# Patient Record
Sex: Female | Born: 1995 | Race: White | Hispanic: No | Marital: Single | Smoking: Current every day smoker
Health system: Southern US, Community
[De-identification: ages and names within clinical notes are randomized; demographics above are authoritative.]

## PROBLEM LIST (undated history)

## (undated) ENCOUNTER — Inpatient Hospital Stay (HOSPITAL_COMMUNITY): Payer: Self-pay

## (undated) DIAGNOSIS — G43909 Migraine, unspecified, not intractable, without status migrainosus: Secondary | ICD-10-CM

## (undated) DIAGNOSIS — Q513 Bicornate uterus: Secondary | ICD-10-CM

## (undated) HISTORY — PX: NO PAST SURGERIES: SHX2092

## (undated) HISTORY — DX: Migraine, unspecified, not intractable, without status migrainosus: G43.909

---
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
## 2014-06-13 ENCOUNTER — Encounter (HOSPITAL_COMMUNITY): Payer: Self-pay | Admitting: *Deleted

## 2014-07-11 ENCOUNTER — Encounter (HOSPITAL_COMMUNITY): Payer: Self-pay

## 2014-07-11 ENCOUNTER — Inpatient Hospital Stay (HOSPITAL_COMMUNITY)
Admission: AD | Admit: 2014-07-11 | Discharge: 2014-07-11 | Disposition: A | Discharge status: Home / Self Care | Payer: Medicaid Other | Source: Ambulatory Visit | Attending: Obstetrics & Gynecology | Admitting: Obstetrics & Gynecology

## 2014-07-11 DIAGNOSIS — O0930 Supervision of pregnancy with insufficient antenatal care, unspecified trimester: Secondary | ICD-10-CM

## 2014-07-11 DIAGNOSIS — M549 Dorsalgia, unspecified: Secondary | ICD-10-CM | POA: Insufficient documentation

## 2014-07-11 DIAGNOSIS — O99332 Smoking (tobacco) complicating pregnancy, second trimester: Secondary | ICD-10-CM | POA: Insufficient documentation

## 2014-07-11 DIAGNOSIS — Z3A25 25 weeks gestation of pregnancy: Secondary | ICD-10-CM | POA: Diagnosis not present

## 2014-07-11 DIAGNOSIS — O34592 Maternal care for other abnormalities of gravid uterus, second trimester: Secondary | ICD-10-CM | POA: Insufficient documentation

## 2014-07-11 DIAGNOSIS — O99891 Other specified diseases and conditions complicating pregnancy: Secondary | ICD-10-CM | POA: Diagnosis present

## 2014-07-11 DIAGNOSIS — O9989 Other specified diseases and conditions complicating pregnancy, childbirth and the puerperium: Secondary | ICD-10-CM | POA: Diagnosis not present

## 2014-07-11 DIAGNOSIS — Q513 Bicornate uterus: Secondary | ICD-10-CM | POA: Diagnosis not present

## 2014-07-11 DIAGNOSIS — F17210 Nicotine dependence, cigarettes, uncomplicated: Secondary | ICD-10-CM | POA: Diagnosis not present

## 2014-07-11 LAB — URINE MICROSCOPIC-ADD ON

## 2014-07-11 LAB — CBC
HEMATOCRIT: 30.3 % — AB (ref 36.0–46.0)
HEMOGLOBIN: 10.2 g/dL — AB (ref 12.0–15.0)
MCH: 28.7 pg (ref 26.0–34.0)
MCHC: 33.7 g/dL (ref 30.0–36.0)
MCV: 85.1 fL (ref 78.0–100.0)
Platelets: 165 10*3/uL (ref 150–400)
RBC: 3.56 MIL/uL — AB (ref 3.87–5.11)
RDW: 13.7 % (ref 11.5–15.5)
WBC: 12 10*3/uL — AB (ref 4.0–10.5)

## 2014-07-11 LAB — URINALYSIS, ROUTINE W REFLEX MICROSCOPIC
BILIRUBIN URINE: NEGATIVE
Glucose, UA: NEGATIVE mg/dL
KETONES UR: NEGATIVE mg/dL
NITRITE: NEGATIVE
Protein, ur: NEGATIVE mg/dL
Specific Gravity, Urine: 1.01 (ref 1.005–1.030)
UROBILINOGEN UA: 0.2 mg/dL (ref 0.0–1.0)
pH: 7 (ref 5.0–8.0)

## 2014-07-11 LAB — DIFFERENTIAL
BASOS ABS: 0 10*3/uL (ref 0.0–0.1)
Basophils Relative: 0 % (ref 0–1)
Eosinophils Absolute: 0.2 10*3/uL (ref 0.0–0.7)
Eosinophils Relative: 1 % (ref 0–5)
LYMPHS ABS: 1.7 10*3/uL (ref 0.7–4.0)
LYMPHS PCT: 14 % (ref 12–46)
MONO ABS: 1.1 10*3/uL — AB (ref 0.1–1.0)
MONOS PCT: 9 % (ref 3–12)
NEUTROS ABS: 9.1 10*3/uL — AB (ref 1.7–7.7)
Neutrophils Relative %: 76 % (ref 43–77)

## 2014-07-11 LAB — HEPATITIS B SURFACE ANTIGEN: Hepatitis B Surface Ag: NEGATIVE

## 2014-07-11 LAB — TYPE AND SCREEN
ABO/RH(D): A POS
Antibody Screen: NEGATIVE

## 2014-07-11 LAB — HIV ANTIBODY (ROUTINE TESTING W REFLEX): HIV: NONREACTIVE

## 2014-07-11 LAB — RPR

## 2014-07-11 LAB — RAPID HIV SCREEN (WH-MAU): SUDS RAPID HIV SCREEN: NONREACTIVE

## 2014-07-11 MED ORDER — ACETAMINOPHEN 500 MG PO TABS: 1000.0000 mg | ORAL_TABLET | Freq: Three times a day (TID) | ORAL | Status: DC | PRN

## 2014-07-11 NOTE — MAU Note (Signed)
Patient states she has had no prenatal care. States she has had irregular periods since last child was born about one year ago. Has felt fetal movement. Denies bleeding, abdominal pain, leaking.

## 2014-07-11 NOTE — MAU Provider Note (Signed)
History     CSN: [id number]  Arrival date and time: 07/11/14 1718   None     Chief Complaint  Patient presents with  . Back Pain   HPI  [name] is a 18 y.o. G3P1011 at around 1925 weeks gestational age by unsure LMP. She first discovered she was pregnant about a month ago. She has not had prenatal care yet and would like [facility]. She presents to the MAU today for evaluation of back pain. She has been having back pain off and on for the last several weeks, but worsening in the last few days. Located in her lower back. Feels like a muscle spasm. Has not taken any medication for this. Denies fevers, chills, chest pain, shortness of breath, abdominal pain, nausea, vomiting, flank pain, dysuria, or other concerning symptoms. +Fetal movement. Denies uterine contractions, loss of fluid, vaginal bleeding.   Mostly came to triage today to ensure her baby was doing okay.   Past Medical History  Diagnosis Date  . Migraine     Typically takes Ibuprofen;lie down in dark room  . Bicornate uterus     Past Surgical History  Procedure Laterality Date  . No past surgeries      Family History  Problem Relation Age of Onset  . Heart disease Paternal Grandfather   . Brain cancer Paternal Grandfather   . Hypertension Paternal Grandfather   . Stroke Paternal Grandfather   . Asthma Mother   . Diabetes Mother   . Migraines Mother   . Kidney disease Cousin     Kidney infections:Maternal  . Migraines Maternal Grandmother     History  Substance Use Topics  . Smoking status: Current Every Day Smoker -- 0.50 packs/day    Types: Cigarettes    Last Attempt to Quit: 07/12/2012  . Smokeless tobacco: Never Used  . Alcohol Use: No    Allergies:  Allergies  Allergen Reactions  . Strawberry Anaphylaxis    Rash inside mouth; throat swells     Prescriptions prior to admission  Medication Sig Dispense Refill Last Dose  . ferrous sulfate 325 (65 FE) MG tablet Take  1 tablet (325 mg total) by mouth 2 (two) times daily with a meal. 60 tablet 3   . ibuprofen (ADVIL,MOTRIN) 600 MG tablet Take 1 tablet (600 mg total) by mouth every 6 (six) hours. 30 tablet 0   . oxyCODONE-acetaminophen (PERCOCET/ROXICET) 5-325 MG per tablet Take 1-2 tablets by mouth every 4 (four) hours as needed for severe pain (moderate - severe pain). 30 tablet 0   . Prenatal Vit-Fe Fumarate-FA (PRENATAL MULTIVITAMIN) TABS Take 1 tablet by mouth daily.   07/22/2013 at Unknown time    Review of Systems  Constitutional: Negative.  Negative for fever, chills and malaise/fatigue.  HENT: Negative.  Negative for congestion and sore throat.   Eyes: Negative.  Negative for double vision and photophobia.  Respiratory: Negative.  Negative for cough, shortness of breath and wheezing.   Cardiovascular: Negative.  Negative for chest pain and leg swelling.  Gastrointestinal: Negative.  Negative for nausea, vomiting, abdominal pain, diarrhea and constipation.  Genitourinary: Negative.  Negative for dysuria, urgency, frequency and hematuria.  Musculoskeletal: Positive for back pain. Negative for myalgias.  Skin: Negative.   Neurological: Negative.  Negative for weakness and headaches.  Psychiatric/Behavioral: Negative.   All other systems reviewed and are negative.  Physical Exam   Blood pressure 101/73, pulse 108, temperature 98.3 F (36.8 C), temperature source Oral, resp.  rate 16, height 5' (1.524 m), weight 128 lb (58.06 kg), SpO2 100 %, unknown if currently breastfeeding.  Physical Exam  Nursing note and vitals reviewed. Constitutional: She is oriented to person, place, and time. She appears well-developed and well-nourished. No distress.  HENT:  Head: Normocephalic and atraumatic.  Cardiovascular: Normal rate.   Respiratory: Effort normal.  Musculoskeletal:  Lumbar paraspinal muscles tender to palpation. FROM. No CVA tenderness or flank pain.   Neurological: She is alert and oriented to  person, place, and time.  Skin: Skin is warm and dry.  Psychiatric: She has a normal mood and affect. Her behavior is normal.    MAU Course  Procedures  MDM NST reactive  Assessment and Plan  [name] is a 18 y.o. G3P1011 at around [redacted] weeks gestation based on an unsure LMP here with back pain consistent with musculoskeletal strain and pains of pregnancy. No red flag signs/symptoms on examination. NST reactive and no contractions.   # Back pain - Reviewed exercises, abdominal binder - Reviewed PTL/FM precautions.   # No PNC - Initial OB labs ordered - Anatomy scan ordered for outpatient - Sent message to clinic staff about scheduling appointment for initial prenatal visit.   William DaltonMcEachern, Taleisha Kaczynski 07/11/2014, 7:15 PM

## 2014-07-11 NOTE — MAU Note (Signed)
Urine in lab 

## 2014-07-11 NOTE — MAU Note (Signed)
Pt states irregular cycles after her [age] year old. Denies bleeding or abnormal vaginal discharge. Having low back pain and pelvic pain.

## 2014-07-12 LAB — RUBELLA SCREEN: Rubella: 1.52 Index — ABNORMAL HIGH (ref <0.90)

## 2014-07-12 LAB — ABO/RH: ABO/RH(D): A POS

## 2014-07-20 ENCOUNTER — Encounter: Payer: Self-pay | Admitting: Advanced Practice Midwife

## 2014-07-20 ENCOUNTER — Ambulatory Visit (INDEPENDENT_AMBULATORY_CARE_PROVIDER_SITE_OTHER): Payer: Medicaid Other | Admitting: Advanced Practice Midwife

## 2014-07-20 VITALS — BP 98/69 | HR 94 | Temp 97.7°F | Wt 128.0 lb

## 2014-07-20 DIAGNOSIS — Z23 Encounter for immunization: Secondary | ICD-10-CM | POA: Diagnosis not present

## 2014-07-20 DIAGNOSIS — Z3493 Encounter for supervision of normal pregnancy, unspecified, third trimester: Secondary | ICD-10-CM

## 2014-07-20 DIAGNOSIS — Z3483 Encounter for supervision of other normal pregnancy, third trimester: Secondary | ICD-10-CM

## 2014-07-20 DIAGNOSIS — O0933 Supervision of pregnancy with insufficient antenatal care, third trimester: Secondary | ICD-10-CM

## 2014-07-20 LAB — POCT URINALYSIS DIP (DEVICE)
Bilirubin Urine: NEGATIVE
Glucose, UA: NEGATIVE mg/dL
Ketones, ur: NEGATIVE mg/dL
Nitrite: NEGATIVE
Protein, ur: NEGATIVE mg/dL
Specific Gravity, Urine: 1.02 (ref 1.005–1.030)
Urobilinogen, UA: 0.2 mg/dL (ref 0.0–1.0)
pH: 5.5 (ref 5.0–8.0)

## 2014-07-20 LAB — OB RESULTS CONSOLE GC/CHLAMYDIA
CHLAMYDIA, DNA PROBE: NEGATIVE
Gonorrhea: NEGATIVE

## 2014-07-20 LAB — OB RESULTS CONSOLE GBS: GBS: POSITIVE

## 2014-07-20 MED ORDER — BUTALBITAL-APAP-CAFFEINE 50-325-40 MG PO TABS: 1.0000 | ORAL_TABLET | Freq: Four times a day (QID) | ORAL | Status: DC | PRN

## 2014-07-20 MED ORDER — TETANUS-DIPHTH-ACELL PERTUSSIS 5-2.5-18.5 LF-MCG/0.5 IM SUSP
0.5000 mL | Freq: Once | INTRAMUSCULAR | Status: AC
  Administered 2014-07-20: 0.5 mL via INTRAMUSCULAR

## 2014-07-20 NOTE — Progress Notes (Signed)
Initial visit today-- dating based on unsure LMP. Will schedule U/S.  Initial lab work done in MAU-- 1hrgtt today. Tdap and flu vaccines today.  C/o of lower back pain and migraines.  C/o of occasional contractions.

## 2014-07-20 NOTE — Patient Instructions (Signed)
Third Trimester of Pregnancy The third trimester is from week 29 through week 42, months 7 through 9. The third trimester is a time when the fetus is growing rapidly. At the end of the ninth month, the fetus is about 20 inches in length and weighs 6-10 pounds.  BODY CHANGES Your body goes through many changes during pregnancy. The changes vary from woman to woman.   Your weight will continue to increase. You can expect to gain 25-35 pounds (11-16 kg) by the end of the pregnancy.  You may begin to get stretch marks on your hips, abdomen, and breasts.  You may urinate more often because the fetus is moving lower into your pelvis and pressing on your bladder.  You may develop or continue to have heartburn as a result of your pregnancy.  You may develop constipation because certain hormones are causing the muscles that push waste through your intestines to slow down.  You may develop hemorrhoids or swollen, bulging veins (varicose veins).  You may have pelvic pain because of the weight gain and pregnancy hormones relaxing your joints between the bones in your pelvis. Backaches may result from overexertion of the muscles supporting your posture.  You may have changes in your hair. These can include thickening of your hair, rapid growth, and changes in texture. Some women also have hair loss during or after pregnancy, or hair that feels dry or thin. Your hair will most likely return to normal after your baby is born.  Your breasts will continue to grow and be tender. A yellow discharge may leak from your breasts called colostrum.  Your belly button may stick out.  You may feel short of breath because of your expanding uterus.  You may notice the fetus "dropping," or moving lower in your abdomen.  You may have a bloody mucus discharge. This usually occurs a few days to a week before labor begins.  Your cervix becomes thin and soft (effaced) near your due date. WHAT TO EXPECT AT YOUR PRENATAL  EXAMS  You will have prenatal exams every 2 weeks until week 36. Then, you will have weekly prenatal exams. During a routine prenatal visit:  You will be weighed to make sure you and the fetus are growing normally.  Your blood pressure is taken.  Your abdomen will be measured to track your baby's growth.  The fetal heartbeat will be listened to.  Any test results from the previous visit will be discussed.  You may have a cervical check near your due date to see if you have effaced. At around 36 weeks, your caregiver will check your cervix. At the same time, your caregiver will also perform a test on the secretions of the vaginal tissue. This test is to determine if a type of bacteria, Group B streptococcus, is present. Your caregiver will explain this further. Your caregiver may ask you:  What your birth plan is.  How you are feeling.  If you are feeling the baby move.  If you have had any abnormal symptoms, such as leaking fluid, bleeding, severe headaches, or abdominal cramping.  If you have any questions. Other tests or screenings that may be performed during your third trimester include:  Blood tests that check for low iron levels (anemia).  Fetal testing to check the health, activity level, and growth of the fetus. Testing is done if you have certain medical conditions or if there are problems during the pregnancy. FALSE LABOR You may feel small, irregular contractions that   eventually go away. These are called Braxton Hicks contractions, or false labor. Contractions may last for hours, days, or even weeks before true labor sets in. If contractions come at regular intervals, intensify, or become painful, it is best to be seen by your caregiver.  SIGNS OF LABOR   Menstrual-like cramps.  Contractions that are 5 minutes apart or less.  Contractions that start on the top of the uterus and spread down to the lower abdomen and back.  A sense of increased pelvic pressure or back  pain.  A watery or bloody mucus discharge that comes from the vagina. If you have any of these signs before the 37th week of pregnancy, call your caregiver right away. You need to go to the hospital to get checked immediately. HOME CARE INSTRUCTIONS   Avoid all smoking, herbs, alcohol, and unprescribed drugs. These chemicals affect the formation and growth of the baby.  Follow your caregiver's instructions regarding medicine use. There are medicines that are either safe or unsafe to take during pregnancy.  Exercise only as directed by your caregiver. Experiencing uterine cramps is a good sign to stop exercising.  Continue to eat regular, healthy meals.  Wear a good support bra for breast tenderness.  Do not use hot tubs, steam rooms, or saunas.  Wear your seat belt at all times when driving.  Avoid raw meat, uncooked cheese, cat litter boxes, and soil used by cats. These carry germs that can cause birth defects in the baby.  Take your prenatal vitamins.  Try taking a stool softener (if your caregiver approves) if you develop constipation. Eat more high-fiber foods, such as fresh vegetables or fruit and whole grains. Drink plenty of fluids to keep your urine clear or pale yellow.  Take warm sitz baths to soothe any pain or discomfort caused by hemorrhoids. Use hemorrhoid cream if your caregiver approves.  If you develop varicose veins, wear support hose. Elevate your feet for 15 minutes, 3-4 times a day. Limit salt in your diet.  Avoid heavy lifting, wear low heal shoes, and practice good posture.  Rest a lot with your legs elevated if you have leg cramps or low back pain.  Visit your dentist if you have not gone during your pregnancy. Use a soft toothbrush to brush your teeth and be gentle when you floss.  A sexual relationship may be continued unless your caregiver directs you otherwise.  Do not travel far distances unless it is absolutely necessary and only with the approval  of your caregiver.  Take prenatal classes to understand, practice, and ask questions about the labor and delivery.  Make a trial run to the hospital.  Pack your hospital bag.  Prepare the baby's nursery.  Continue to go to all your prenatal visits as directed by your caregiver. SEEK MEDICAL CARE IF:  You are unsure if you are in labor or if your water has broken.  You have dizziness.  You have mild pelvic cramps, pelvic pressure, or nagging pain in your abdominal area.  You have persistent nausea, vomiting, or diarrhea.  You have a bad smelling vaginal discharge.  You have pain with urination. SEEK IMMEDIATE MEDICAL CARE IF:   You have a fever.  You are leaking fluid from your vagina.  You have spotting or bleeding from your vagina.  You have severe abdominal cramping or pain.  You have rapid weight loss or gain.  You have shortness of breath with chest pain.  You notice sudden or extreme swelling   of your face, hands, ankles, feet, or legs.  You have not felt your baby move in over an hour.  You have severe headaches that do not go away with medicine.  You have vision changes. Document Released: 07/23/2001 Document Revised: 08/03/2013 Document Reviewed: 09/29/2012 Exit[employer] Patient Information 2015 [employer]. This information is not intended to replace advice given to you by your health care provider. Make sure you discuss any questions you have with your health care provider.  

## 2014-07-20 NOTE — Progress Notes (Signed)
   Subjective:    [name] is a [id number] 3422w3d being seen today for her first obstetrical visit.  Her obstetrical history is significant for anemia. Patient does intend to breast feed. Pregnancy history fully reviewed.  Patient reports backache, no bleeding, no contractions, no cramping and no leaking. Patient had questions regarding normal changes during pregnancy, water intake and use of baby ASA. All of the above mentioned subjects discussed in detail.  Filed Vitals:   07/20/14 1018  BP: 98/69  Pulse: 94  Temp: 97.7 F (36.5 C)  Weight: 128 lb (58.06 kg)    HISTORY: OB History  Gravida Para Term Preterm AB SAB TAB Ectopic Multiple Living  3 1 1  1 1    1     # Outcome Date GA Lbr Len/2nd Weight Sex Delivery Anes PTL Lv  3 Current           2 Term 07/23/13 1140w3d 04:40 / 00:15 5 lb 10.7 oz (2.571 kg) F Vag-Spont EPI  Y  1 SAB  3747w0d            Comments: System Generated. Please review and update pregnancy details.     Past Medical History  Diagnosis Date  . Migraine     Typically takes Ibuprofen;lie down in dark room  . Bicornate uterus    Past Surgical History  Procedure Laterality Date  . No past surgeries     Family History  Problem Relation Age of Onset  . Heart disease Paternal Grandfather   . Brain cancer Paternal Grandfather   . Hypertension Paternal Grandfather   . Stroke Paternal Grandfather   . Asthma Mother   . Diabetes Mother   . Migraines Mother   . Kidney disease Cousin     Kidney infections:Maternal  . Migraines Maternal Grandmother      Exam    Uterus:     Pelvic Exam:    Perineum: Normal Perineum   Vulva: normal   Vagina:  normal mucosa   pH: n/a   Cervix: closed   Adnexa: not evaluated   Bony Pelvis: average  System: Breast:  normal appearance, no masses or tenderness   Skin: normal coloration and turgor, no rashes    Neurologic: oriented, normal, normal mood   Extremities: normal strength, tone, and muscle mass   HEENT  PERRLA, neck supple with midline trachea and trachea midline   Mouth/Teeth mucous membranes moist, pharynx normal without lesions   Neck supple and no masses   Cardiovascular: regular rate and rhythm   Respiratory:  appears well, vitals normal, no respiratory distress, acyanotic, normal RR, ear and throat exam is normal, neck free of mass or lymphadenopathy, chest clear, no wheezing, crepitations, rhonchi, normal symmetric air entry   Abdomen: Gravid, no tenderness, positive bowel sounds   Urinary: bladder not palpable      Assessment:    Pregnancy: [id number] Patient Active Problem List   Diagnosis Date Noted  . Back pain affecting pregnancy 07/11/2014  . No prenatal care in current pregnancy 07/11/2014  . Anemia, iron deficiency 07/26/2013        Plan:     Initial labs drawn. Prenatal vitamins. Problem list reviewed and updated. Genetic Screening discussed : too late for quad screen Ultrasound discussed; fetal survey: ordered. Follow up in 4 weeks. 50% of 40 min visit spent on counseling and coordination of care.     Zidan Helget SHWON 07/20/2014  12:00 PM

## 2014-07-21 LAB — GLUCOSE TOLERANCE, 1 HOUR (50G) W/O FASTING: GLUCOSE 1 HOUR GTT: 96 mg/dL (ref 70–140)

## 2014-07-21 LAB — GC/CHLAMYDIA PROBE AMP
CT PROBE, AMP APTIMA: NEGATIVE
GC Probe RNA: NEGATIVE

## 2014-07-22 LAB — CULTURE, OB URINE: Colony Count: >=100000

## 2014-07-24 ENCOUNTER — Encounter: Payer: Self-pay | Admitting: Advanced Practice Midwife

## 2014-07-24 DIAGNOSIS — Z3493 Encounter for supervision of normal pregnancy, unspecified, third trimester: Secondary | ICD-10-CM | POA: Insufficient documentation

## 2014-07-25 ENCOUNTER — Telehealth: Payer: Self-pay | Admitting: *Deleted

## 2014-07-25 ENCOUNTER — Encounter: Payer: Self-pay | Admitting: *Deleted

## 2014-07-25 DIAGNOSIS — N390 Urinary tract infection, site not specified: Secondary | ICD-10-CM

## 2014-07-25 LAB — CANNABANOIDS (GC/LC/MS), URINE: THC-COOH UR CONFIRM: 769 ng/mL — AB (ref <5)

## 2014-07-25 MED ORDER — AMOXICILLIN 875 MG PO TABS: 875.0000 mg | ORAL_TABLET | Freq: Two times a day (BID) | ORAL | Status: DC

## 2014-07-25 NOTE — Telephone Encounter (Signed)
Attempted to contact patient with results, no answer, message states that we have reached a number that the mailbox has not been set up.  Will send certified letter.  Letter sent.

## 2014-07-25 NOTE — Telephone Encounter (Signed)
-----   Message from [place], [place] sent at 07/24/2014  5:03 AM EST ----- GBS UTI. Amoxicillin 875 mg by mouth twice a day 7 days.

## 2014-07-25 NOTE — Progress Notes (Signed)
I was present for the exam and agree with above.  [place] Lucah Petta, CNM 07/25/2014 11:42 PM

## 2014-07-26 ENCOUNTER — Ambulatory Visit (HOSPITAL_COMMUNITY)
Admission: RE | Admit: 2014-07-26 | Discharge: 2014-07-26 | Disposition: A | Discharge status: Home / Self Care | Payer: Medicaid Other | Source: Ambulatory Visit | Attending: Advanced Practice Midwife | Admitting: Advanced Practice Midwife

## 2014-07-26 ENCOUNTER — Other Ambulatory Visit: Payer: Self-pay | Admitting: Advanced Practice Midwife

## 2014-07-26 ENCOUNTER — Encounter: Payer: Self-pay | Admitting: Advanced Practice Midwife

## 2014-07-26 DIAGNOSIS — O99333 Smoking (tobacco) complicating pregnancy, third trimester: Secondary | ICD-10-CM | POA: Insufficient documentation

## 2014-07-26 DIAGNOSIS — Z1389 Encounter for screening for other disorder: Secondary | ICD-10-CM | POA: Insufficient documentation

## 2014-07-26 DIAGNOSIS — O0933 Supervision of pregnancy with insufficient antenatal care, third trimester: Secondary | ICD-10-CM

## 2014-07-26 DIAGNOSIS — Z3A29 29 weeks gestation of pregnancy: Secondary | ICD-10-CM | POA: Insufficient documentation

## 2014-07-26 DIAGNOSIS — Z36 Encounter for antenatal screening of mother: Secondary | ICD-10-CM | POA: Diagnosis not present

## 2014-07-26 DIAGNOSIS — F17210 Nicotine dependence, cigarettes, uncomplicated: Secondary | ICD-10-CM | POA: Insufficient documentation

## 2014-07-26 DIAGNOSIS — F1290 Cannabis use, unspecified, uncomplicated: Secondary | ICD-10-CM | POA: Insufficient documentation

## 2014-07-26 LAB — PRESCRIPTION MONITORING PROFILE (19 PANEL)
AMPHETAMINE/METH: NEGATIVE ng/mL
BUPRENORPHINE, URINE: NEGATIVE ng/mL
Barbiturate Screen, Urine: NEGATIVE ng/mL
Benzodiazepine Screen, Urine: NEGATIVE ng/mL
CARISOPRODOL, URINE: NEGATIVE ng/mL
COCAINE METABOLITES: NEGATIVE ng/mL
Creatinine, Urine: 152.58 mg/dL (ref >20.0)
FENTANYL URINE: NEGATIVE ng/mL
MDMA URINE: NEGATIVE ng/mL
METHADONE SCREEN, URINE: NEGATIVE ng/mL
METHAQUALONE SCREEN (URINE): NEGATIVE ng/mL
Meperidine, Ur: NEGATIVE ng/mL
Nitrites, Initial: NEGATIVE ug/mL
Opiate Screen, Urine: NEGATIVE ng/mL
Oxycodone Screen, Ur: NEGATIVE ng/mL
Phencyclidine, Ur: NEGATIVE ng/mL
Propoxyphene: NEGATIVE ng/mL
TAPENTADOLUR: NEGATIVE ng/mL
Tramadol Scrn, Ur: NEGATIVE ng/mL
Zolpidem, Urine: NEGATIVE ng/mL
pH, Initial: 6 pH (ref 4.5–8.9)

## 2014-07-27 ENCOUNTER — Encounter: Payer: Self-pay | Admitting: Advanced Practice Midwife

## 2014-08-03 ENCOUNTER — Encounter: Payer: Medicaid Other | Admitting: Obstetrics & Gynecology

## 2014-08-10 ENCOUNTER — Encounter: Payer: Medicaid Other | Admitting: Obstetrics & Gynecology

## 2014-08-17 ENCOUNTER — Encounter: Payer: Medicaid Other | Admitting: Advanced Practice Midwife

## 2014-08-17 ENCOUNTER — Ambulatory Visit (INDEPENDENT_AMBULATORY_CARE_PROVIDER_SITE_OTHER): Payer: Medicaid Other | Admitting: Advanced Practice Midwife

## 2014-08-17 VITALS — BP 107/75 | HR 97 | Temp 98.4°F | Wt 133.4 lb

## 2014-08-17 DIAGNOSIS — O2340 Unspecified infection of urinary tract in pregnancy, unspecified trimester: Secondary | ICD-10-CM

## 2014-08-17 DIAGNOSIS — B951 Streptococcus, group B, as the cause of diseases classified elsewhere: Secondary | ICD-10-CM

## 2014-08-17 DIAGNOSIS — O2343 Unspecified infection of urinary tract in pregnancy, third trimester: Secondary | ICD-10-CM

## 2014-08-17 LAB — POCT URINALYSIS DIP (DEVICE)
BILIRUBIN URINE: NEGATIVE
GLUCOSE, UA: NEGATIVE mg/dL
Ketones, ur: NEGATIVE mg/dL
Nitrite: NEGATIVE
PROTEIN: NEGATIVE mg/dL
Specific Gravity, Urine: 1.02 (ref 1.005–1.030)
Urobilinogen, UA: 1 mg/dL (ref 0.0–1.0)
pH: 6 (ref 5.0–8.0)

## 2014-08-17 NOTE — Progress Notes (Signed)
Pain- back Reorder Amox for GBS in urine- pt never took medication

## 2014-08-17 NOTE — Progress Notes (Signed)
Doing well.  Good fetal movement, denies vaginal bleeding, LOF, regular contractions.  Discussed GBS positive urine and UTI with pt.  Pt to pick up amoxicillin today and complete course.  Will give PCN during labor.  Discussed sleep during pregnancy, recommend warm bath nightly, exercise daily, Benadryl PRN for sleep.

## 2014-08-18 DIAGNOSIS — O0933 Supervision of pregnancy with insufficient antenatal care, third trimester: Secondary | ICD-10-CM

## 2014-08-18 DIAGNOSIS — O2343 Unspecified infection of urinary tract in pregnancy, third trimester: Secondary | ICD-10-CM

## 2014-08-18 DIAGNOSIS — O99820 Streptococcus B carrier state complicating pregnancy: Secondary | ICD-10-CM

## 2014-08-23 ENCOUNTER — Encounter: Payer: Self-pay | Admitting: General Practice

## 2014-08-24 ENCOUNTER — Encounter: Payer: Medicaid Other | Admitting: Advanced Practice Midwife

## 2014-08-24 ENCOUNTER — Encounter: Payer: Self-pay | Admitting: Advanced Practice Midwife

## 2014-08-24 ENCOUNTER — Telehealth: Payer: Self-pay | Admitting: Advanced Practice Midwife

## 2014-08-24 NOTE — Telephone Encounter (Signed)
Patient missed her ob follow-up. Attempted to call patient number disconnected, mailing certified letter.

## 2014-09-15 ENCOUNTER — Encounter (HOSPITAL_COMMUNITY): Payer: Self-pay | Admitting: *Deleted

## 2014-09-15 ENCOUNTER — Encounter (HOSPITAL_COMMUNITY): Payer: Self-pay | Admitting: Anesthesiology

## 2014-09-15 ENCOUNTER — Inpatient Hospital Stay (HOSPITAL_COMMUNITY)
Admission: AD | Admit: 2014-09-15 | Discharge: 2014-09-17 | DRG: 766 | Disposition: A | Discharge status: Home / Self Care | Payer: Medicaid Other | Source: Ambulatory Visit | Attending: Family Medicine | Admitting: Family Medicine

## 2014-09-15 DIAGNOSIS — O99334 Smoking (tobacco) complicating childbirth: Secondary | ICD-10-CM | POA: Diagnosis present

## 2014-09-15 DIAGNOSIS — O34592 Maternal care for other abnormalities of gravid uterus, second trimester: Secondary | ICD-10-CM | POA: Diagnosis not present

## 2014-09-15 DIAGNOSIS — F17210 Nicotine dependence, cigarettes, uncomplicated: Secondary | ICD-10-CM | POA: Diagnosis present

## 2014-09-15 DIAGNOSIS — Z8249 Family history of ischemic heart disease and other diseases of the circulatory system: Secondary | ICD-10-CM | POA: Diagnosis not present

## 2014-09-15 DIAGNOSIS — Z30018 Encounter for initial prescription of other contraceptives: Secondary | ICD-10-CM

## 2014-09-15 DIAGNOSIS — O99013 Anemia complicating pregnancy, third trimester: Secondary | ICD-10-CM | POA: Diagnosis not present

## 2014-09-15 DIAGNOSIS — O321XX0 Maternal care for breech presentation, not applicable or unspecified: Secondary | ICD-10-CM | POA: Diagnosis present

## 2014-09-15 DIAGNOSIS — Q513 Bicornate uterus: Secondary | ICD-10-CM

## 2014-09-15 DIAGNOSIS — Z833 Family history of diabetes mellitus: Secondary | ICD-10-CM

## 2014-09-15 DIAGNOSIS — IMO0001 Reserved for inherently not codable concepts without codable children: Secondary | ICD-10-CM

## 2014-09-15 DIAGNOSIS — O0933 Supervision of pregnancy with insufficient antenatal care, third trimester: Secondary | ICD-10-CM

## 2014-09-15 DIAGNOSIS — Z823 Family history of stroke: Secondary | ICD-10-CM

## 2014-09-15 DIAGNOSIS — O9902 Anemia complicating childbirth: Secondary | ICD-10-CM | POA: Diagnosis present

## 2014-09-15 DIAGNOSIS — D649 Anemia, unspecified: Secondary | ICD-10-CM | POA: Diagnosis present

## 2014-09-15 DIAGNOSIS — Z3A37 37 weeks gestation of pregnancy: Secondary | ICD-10-CM | POA: Diagnosis present

## 2014-09-15 DIAGNOSIS — Z98891 History of uterine scar from previous surgery: Secondary | ICD-10-CM

## 2014-09-15 DIAGNOSIS — O34593 Maternal care for other abnormalities of gravid uterus, third trimester: Secondary | ICD-10-CM | POA: Diagnosis present

## 2014-09-15 DIAGNOSIS — O99824 Streptococcus B carrier state complicating childbirth: Secondary | ICD-10-CM | POA: Diagnosis present

## 2014-09-15 DIAGNOSIS — Z30017 Encounter for initial prescription of implantable subdermal contraceptive: Secondary | ICD-10-CM

## 2014-09-15 DIAGNOSIS — Z3493 Encounter for supervision of normal pregnancy, unspecified, third trimester: Secondary | ICD-10-CM

## 2014-09-15 MED ORDER — OXYTOCIN 40 UNITS IN LACTATED RINGERS INFUSION - SIMPLE MED: 62.5000 mL/h | INTRAVENOUS | Status: DC

## 2014-09-15 MED ORDER — OXYTOCIN BOLUS FROM INFUSION: 500.0000 mL | INTRAVENOUS | Status: DC

## 2014-09-15 MED ORDER — ACETAMINOPHEN 325 MG PO TABS: 650.0000 mg | ORAL_TABLET | ORAL | Status: DC | PRN

## 2014-09-15 MED ORDER — OXYCODONE-ACETAMINOPHEN 5-325 MG PO TABS: 2.0000 | ORAL_TABLET | ORAL | Status: DC | PRN

## 2014-09-15 MED ORDER — LACTATED RINGERS IV SOLN: 500.0000 mL | INTRAVENOUS | Status: DC | PRN

## 2014-09-15 MED ORDER — DEXTROSE 5 % IV SOLN
5.0000 10*6.[IU] | Freq: Once | INTRAVENOUS | Status: DC
  Filled 2014-09-15: qty 5

## 2014-09-15 MED ORDER — LIDOCAINE HCL (PF) 1 % IJ SOLN: 30.0000 mL | INTRAMUSCULAR | Status: DC | PRN

## 2014-09-15 MED ORDER — CITRIC ACID-SODIUM CITRATE 334-500 MG/5ML PO SOLN
30.0000 mL | ORAL | Status: DC | PRN
  Administered 2014-09-16: 30 mL via ORAL

## 2014-09-15 MED ORDER — OXYCODONE-ACETAMINOPHEN 5-325 MG PO TABS: 1.0000 | ORAL_TABLET | ORAL | Status: DC | PRN

## 2014-09-15 MED ORDER — PENICILLIN G POTASSIUM 5000000 UNITS IJ SOLR
2.5000 10*6.[IU] | INTRAMUSCULAR | Status: DC
  Filled 2014-09-15: qty 2.5

## 2014-09-15 MED ORDER — LACTATED RINGERS IV SOLN: INTRAVENOUS | Status: DC

## 2014-09-15 MED ORDER — ONDANSETRON HCL 4 MG/2ML IJ SOLN: 4.0000 mg | Freq: Four times a day (QID) | INTRAMUSCULAR | Status: DC | PRN

## 2014-09-15 NOTE — MAU Note (Signed)
Pt states she has been having contractions since 3 am  09/15/2014 Pt states contractions are coming about q 5mins.

## 2014-09-15 NOTE — H&P (Signed)
[name] is a 19 y.o. G60P1011 female at [redacted]w[redacted]d by unsure LMP and 29 week [place],  presenting in active labor.   Reports active fetal movement, contractions: regular, vaginal bleeding: none, membranes: intact. Initiated prenatal care at [facility] at 29 wks.   Most recent u/s 07/26/14. Pregnancy complicated by limited prenatal care, anemia, +GBS & marijuana use.  H/O Migraines, bicornate uterus  Past Medical History: Past Medical History  Diagnosis Date  . Migraine     Typically takes Ibuprofen;lie down in dark room  . Bicornate uterus     Past Surgical History: Past Surgical History  Procedure Laterality Date  . No past surgeries      Obstetrical History: OB History    Gravida Para Term Preterm AB TAB SAB Ectopic Multiple Living   Social History: History   Social History  . Marital Status: Single    Spouse Name: N/A    Number of Children: N/A  . Years of Education: 10   Social History Main Topics  . Smoking status: Current Every Day Smoker -- 0.50 packs/day    Types: Cigarettes    Last Attempt to Quit: 07/12/2012  . Smokeless tobacco: Never Used  . Alcohol Use: No  . Drug Use: No  . Sexual Activity:    Partners: Male   Other Topics Concern  . None   Social History Narrative    Family History: Family History  Problem Relation Age of Onset  . Heart disease Paternal Grandfather   . Brain cancer Paternal Grandfather   . Hypertension Paternal Grandfather   . Stroke Paternal Grandfather   . Asthma Mother   . Diabetes Mother   . Migraines Mother   . Kidney disease Cousin     Kidney infections:Maternal  . Migraines Maternal Grandmother     Allergies: Allergies  Allergen Reactions  . Shrimp [Shellfish Allergy] Shortness Of Breath and Swelling  . Strawberry Anaphylaxis    Rash inside mouth; throat swells     Prescriptions prior to admission  Medication Sig Dispense Refill Last Dose  . acetaminophen (TYLENOL) 500 MG tablet Take 2  tablets (1,000 mg total) by mouth every 8 (eight) hours as needed for headache. 30 tablet 0 Taking  . amoxicillin (AMOXIL) 875 MG tablet Take 1 tablet (875 mg total) by mouth 2 (two) times daily. 14 tablet 0   . butalbital-acetaminophen-caffeine (FIORICET) 50-325-40 MG per tablet Take 1-2 tablets by mouth every 6 (six) hours as needed for headache. 20 tablet 1   . Prenatal Vit-Fe Fumarate-FA (PRENATAL MULTIVITAMIN) TABS Take 1 tablet by mouth daily.   Taking     Review of Systems  Pertinent pos/neg as indicated in HPI   Blood pressure 125/86, pulse 99, resp. rate 16, height  (1.499 m), weight 65.318 kg (144 lb), last menstrual period 12/19/2013, SpO2 99 %, unknown if currently breastfeeding. General appearance: alert, cooperative and no distress Lungs: clear to auscultation bilaterally Heart: regular rate and rhythm Abdomen: gravid, soft, non-tender Extremities: no edema DTR's 3+  Fetal monitoring: FHR: 144 bpm, variability: moderate,  Accelerations: Present,  decelerations:  Absent Uterine activity: regular  Dilation: 6.5 Effacement (%): 80 Station: -2 Exam by:: Carly Norrie RN Presentation: breech confirmed by [place]   Prenatal labs: ABO, Rh: --/--/A POS (11/30 1938) Antibody: NEG (11/30 1933) Rubella:  IMMUNE RPR: NON REAC (11/30 1933)  HBsAg: NEGATIVE (11/30 1933)  HIV: NONREACTIVE (11/30 1933)  GBS:  POSITIVE  1 hr Glucola: 96 Genetic screening:  Not performed Anatomy US: Normal female, but incomplete exam  No results found for this or any previous visit (from the past 24 hour(s)).   Assessment:  8134w0d SIUP  G3P1011  Active labor  Cat 1 FHR  GBS  positive  Breech  Bicornuate uterus  Plan:  Terbutaline was given to stop contractions.  The risks of cesarean section discussed with the patient included but were not limited to: bleeding which may require transfusion or reoperation; infection which may require antibiotics; injury to bowel, bladder, ureters or  other surrounding organs; injury to the fetus; need for additional procedures including hysterectomy in the event of a life-threatening hemorrhage; placental abnormalities wth subsequent pregnancies, incisional problems, thromboembolic phenomenon and other postoperative/anesthesia complications. The patient concurred with the proposed plan, giving informed written consent for the procedure.   Patient has been NPO 1 hour ago.  She will remain NPO for procedure. Anesthesia and OR aware.  Preoperative prophylactic Ancef ordered on call to the OR.  To OR when ready.  Carly CelesteSTINSON, [name] [name] 09/16/2014 12:13 AM

## 2014-09-15 NOTE — Anesthesia Preprocedure Evaluation (Deleted)
Anesthesia Evaluation  Patient identified by MRN, date of birth, ID band Patient awake    Reviewed: Allergy & Precautions, NPO status , Patient's Chart, lab work & pertinent test results  Airway Mallampati: III  TM Distance: >3 FB Neck ROM: Full    Dental no notable dental hx. (+) Teeth Intact   Pulmonary Current Smoker,  breath sounds clear to auscultation  Pulmonary exam normal       Cardiovascular negative cardio ROS  Rhythm:Regular Rate:Normal     Neuro/Psych  Headaches, negative psych ROS   GI/Hepatic Neg liver ROS, GERD-  ,  Endo/Other  negative endocrine ROS  Renal/GU negative Renal ROS     Musculoskeletal negative musculoskeletal ROS (+)   Abdominal   Peds  Hematology  (+) anemia ,   Anesthesia Other Findings   Reproductive/Obstetrics (+) Pregnancy Breech presentation Bicornuate uterus                             Anesthesia Physical Anesthesia Plan  ASA: II and emergent  Anesthesia Plan: Spinal   Post-op Pain Management:    Induction:   Airway Management Planned: Natural Airway  Additional Equipment:   Intra-op Plan:   Post-operative Plan:   Informed Consent: I have reviewed the patients History and Physical, chart, labs and discussed the procedure including the risks, benefits and alternatives for the proposed anesthesia with the patient or authorized representative who has indicated his/her understanding and acceptance.     Plan Discussed with: Anesthesiologist, CRNA and Surgeon  Anesthesia Plan Comments:         Anesthesia Quick Evaluation

## 2014-09-16 ENCOUNTER — Encounter (HOSPITAL_COMMUNITY)
Admission: AD | Disposition: A | Discharge status: Home / Self Care | Payer: Self-pay | Source: Ambulatory Visit | Attending: Family Medicine

## 2014-09-16 ENCOUNTER — Inpatient Hospital Stay (HOSPITAL_COMMUNITY): Payer: Medicaid Other | Admitting: Anesthesiology

## 2014-09-16 ENCOUNTER — Encounter (HOSPITAL_COMMUNITY): Payer: Self-pay

## 2014-09-16 DIAGNOSIS — D649 Anemia, unspecified: Secondary | ICD-10-CM

## 2014-09-16 DIAGNOSIS — O99013 Anemia complicating pregnancy, third trimester: Secondary | ICD-10-CM

## 2014-09-16 DIAGNOSIS — Z30017 Encounter for initial prescription of implantable subdermal contraceptive: Secondary | ICD-10-CM

## 2014-09-16 DIAGNOSIS — O99323 Drug use complicating pregnancy, third trimester: Secondary | ICD-10-CM

## 2014-09-16 DIAGNOSIS — Z3A37 37 weeks gestation of pregnancy: Secondary | ICD-10-CM

## 2014-09-16 DIAGNOSIS — O34592 Maternal care for other abnormalities of gravid uterus, second trimester: Secondary | ICD-10-CM

## 2014-09-16 DIAGNOSIS — O99824 Streptococcus B carrier state complicating childbirth: Secondary | ICD-10-CM

## 2014-09-16 DIAGNOSIS — O321XX0 Maternal care for breech presentation, not applicable or unspecified: Secondary | ICD-10-CM

## 2014-09-16 DIAGNOSIS — Z98891 History of uterine scar from previous surgery: Secondary | ICD-10-CM

## 2014-09-16 DIAGNOSIS — F1210 Cannabis abuse, uncomplicated: Secondary | ICD-10-CM

## 2014-09-16 LAB — CBC
HCT: 33.2 % — ABNORMAL LOW (ref 36.0–46.0)
Hemoglobin: 10.9 g/dL — ABNORMAL LOW (ref 12.0–15.0)
MCH: 26 pg (ref 26.0–34.0)
MCHC: 32.8 g/dL (ref 30.0–36.0)
MCV: 79.2 fL (ref 78.0–100.0)
Platelets: 191 10*3/uL (ref 150–400)
RBC: 4.19 MIL/uL (ref 3.87–5.11)
RDW: 15.2 % (ref 11.5–15.5)
WBC: 20.7 10*3/uL — ABNORMAL HIGH (ref 4.0–10.5)

## 2014-09-16 LAB — RAPID URINE DRUG SCREEN, HOSP PERFORMED
AMPHETAMINES: NOT DETECTED
BENZODIAZEPINES: NOT DETECTED
Barbiturates: NOT DETECTED
Cocaine: NOT DETECTED
Opiates: NOT DETECTED
TETRAHYDROCANNABINOL: POSITIVE — AB

## 2014-09-16 LAB — TYPE AND SCREEN
ABO/RH(D): A POS
Antibody Screen: NEGATIVE

## 2014-09-16 SURGERY — Surgical Case
Anesthesia: Spinal | Site: Abdomen

## 2014-09-16 MED ORDER — MORPHINE SULFATE (PF) 0.5 MG/ML IJ SOLN
INTRAMUSCULAR | Status: DC | PRN
  Administered 2014-09-16: .15 mg via INTRATHECAL

## 2014-09-16 MED ORDER — TERBUTALINE SULFATE 1 MG/ML IJ SOLN
INTRAMUSCULAR | Status: AC
  Filled 2014-09-16: qty 1

## 2014-09-16 MED ORDER — WITCH HAZEL-GLYCERIN EX PADS: 1.0000 "application " | MEDICATED_PAD | CUTANEOUS | Status: DC | PRN

## 2014-09-16 MED ORDER — SIMETHICONE 80 MG PO CHEW
80.0000 mg | CHEWABLE_TABLET | ORAL | Status: DC | PRN
  Filled 2014-09-16 (×2): qty 1

## 2014-09-16 MED ORDER — NALBUPHINE HCL 10 MG/ML IJ SOLN: 5.0000 mg | Freq: Once | INTRAMUSCULAR | Status: DC | PRN

## 2014-09-16 MED ORDER — NALOXONE HCL 1 MG/ML IJ SOLN
1.0000 ug/kg/h | INTRAMUSCULAR | Status: DC | PRN
  Filled 2014-09-16: qty 2

## 2014-09-16 MED ORDER — MEPERIDINE HCL 25 MG/ML IJ SOLN
INTRAMUSCULAR | Status: AC
  Filled 2014-09-16: qty 1

## 2014-09-16 MED ORDER — OXYTOCIN 40 UNITS IN LACTATED RINGERS INFUSION - SIMPLE MED: 62.5000 mL/h | INTRAVENOUS | Status: AC

## 2014-09-16 MED ORDER — DIPHENHYDRAMINE HCL 25 MG PO CAPS
25.0000 mg | ORAL_CAPSULE | Freq: Four times a day (QID) | ORAL | Status: DC | PRN
Start: 2014-09-16 — End: 2014-09-17
  Filled 2014-09-16 (×2): qty 1

## 2014-09-16 MED ORDER — ZOLPIDEM TARTRATE 5 MG PO TABS: 5.0000 mg | ORAL_TABLET | Freq: Every evening | ORAL | Status: DC | PRN

## 2014-09-16 MED ORDER — PHENYLEPHRINE 8 MG IN D5W 100 ML (0.08MG/ML) PREMIX OPTIME
INJECTION | INTRAVENOUS | Status: DC | PRN
  Administered 2014-09-16: 40 ug/min via INTRAVENOUS

## 2014-09-16 MED ORDER — DIPHENHYDRAMINE HCL 50 MG/ML IJ SOLN: 12.5000 mg | INTRAMUSCULAR | Status: DC | PRN

## 2014-09-16 MED ORDER — KETOROLAC TROMETHAMINE 30 MG/ML IJ SOLN: 30.0000 mg | Freq: Four times a day (QID) | INTRAMUSCULAR | Status: DC | PRN

## 2014-09-16 MED ORDER — MEPERIDINE HCL 25 MG/ML IJ SOLN
INTRAMUSCULAR | Status: DC | PRN
  Administered 2014-09-16 (×2): 12.5 mg via INTRAVENOUS

## 2014-09-16 MED ORDER — SCOPOLAMINE 1 MG/3DAYS TD PT72
MEDICATED_PATCH | TRANSDERMAL | Status: AC
  Filled 2014-09-16: qty 1

## 2014-09-16 MED ORDER — LACTATED RINGERS IV SOLN
40.0000 [IU] | INTRAVENOUS | Status: DC | PRN
  Administered 2014-09-16: 40 [IU] via INTRAVENOUS

## 2014-09-16 MED ORDER — FENTANYL CITRATE 0.05 MG/ML IJ SOLN
25.0000 ug | INTRAMUSCULAR | Status: DC | PRN
  Administered 2014-09-16 (×2): 50 ug via INTRAVENOUS

## 2014-09-16 MED ORDER — ONDANSETRON HCL 4 MG/2ML IJ SOLN: 4.0000 mg | Freq: Three times a day (TID) | INTRAMUSCULAR | Status: DC | PRN

## 2014-09-16 MED ORDER — ONDANSETRON HCL 4 MG/2ML IJ SOLN
INTRAMUSCULAR | Status: AC
  Filled 2014-09-16: qty 2

## 2014-09-16 MED ORDER — OXYTOCIN 10 UNIT/ML IJ SOLN
INTRAMUSCULAR | Status: AC
  Filled 2014-09-16: qty 4

## 2014-09-16 MED ORDER — NALBUPHINE HCL 10 MG/ML IJ SOLN: 5.0000 mg | INTRAMUSCULAR | Status: DC | PRN

## 2014-09-16 MED ORDER — SCOPOLAMINE 1 MG/3DAYS TD PT72: 1.0000 | MEDICATED_PATCH | Freq: Once | TRANSDERMAL | Status: DC

## 2014-09-16 MED ORDER — CEFAZOLIN SODIUM-DEXTROSE 2-3 GM-% IV SOLR
2.0000 g | INTRAVENOUS | Status: AC
  Administered 2014-09-16: 2 g via INTRAVENOUS

## 2014-09-16 MED ORDER — DIBUCAINE 1 % RE OINT
1.0000 "application " | TOPICAL_OINTMENT | RECTAL | Status: DC | PRN
  Filled 2014-09-16: qty 28

## 2014-09-16 MED ORDER — ONDANSETRON HCL 4 MG/2ML IJ SOLN
INTRAMUSCULAR | Status: DC | PRN
  Administered 2014-09-16: 4 mg via INTRAVENOUS

## 2014-09-16 MED ORDER — FENTANYL CITRATE 0.05 MG/ML IJ SOLN
INTRAMUSCULAR | Status: DC | PRN
  Administered 2014-09-16: 25 ug via INTRAVENOUS
  Administered 2014-09-16: 25 ug via INTRATHECAL

## 2014-09-16 MED ORDER — BUPIVACAINE IN DEXTROSE 0.75-8.25 % IT SOLN
INTRATHECAL | Status: DC | PRN
  Administered 2014-09-16: 1.2 mL via INTRATHECAL

## 2014-09-16 MED ORDER — FENTANYL CITRATE 0.05 MG/ML IJ SOLN: 25.0000 ug | INTRAMUSCULAR | Status: DC | PRN

## 2014-09-16 MED ORDER — NALBUPHINE HCL 10 MG/ML IJ SOLN: 5.0000 mg | Freq: Once | INTRAMUSCULAR | Status: AC | PRN

## 2014-09-16 MED ORDER — METOCLOPRAMIDE HCL 5 MG/ML IJ SOLN
INTRAMUSCULAR | Status: DC | PRN
  Administered 2014-09-16: 10 mg via INTRAVENOUS

## 2014-09-16 MED ORDER — NALOXONE HCL 0.4 MG/ML IJ SOLN: 0.4000 mg | INTRAMUSCULAR | Status: DC | PRN

## 2014-09-16 MED ORDER — SIMETHICONE 80 MG PO CHEW
80.0000 mg | CHEWABLE_TABLET | ORAL | Status: DC
  Administered 2014-09-17: 80 mg via ORAL
  Filled 2014-09-16 (×3): qty 1

## 2014-09-16 MED ORDER — DIPHENHYDRAMINE HCL 25 MG PO CAPS
25.0000 mg | ORAL_CAPSULE | ORAL | Status: DC | PRN
  Filled 2014-09-16: qty 1

## 2014-09-16 MED ORDER — IBUPROFEN 600 MG PO TABS
600.0000 mg | ORAL_TABLET | Freq: Four times a day (QID) | ORAL | Status: DC
  Administered 2014-09-16 – 2014-09-17 (×5): 600 mg via ORAL
  Filled 2014-09-16 (×5): qty 1

## 2014-09-16 MED ORDER — NALBUPHINE HCL 10 MG/ML IJ SOLN
INTRAMUSCULAR | Status: AC
  Filled 2014-09-16: qty 1

## 2014-09-16 MED ORDER — PHENYLEPHRINE HCL 10 MG/ML IJ SOLN
INTRAMUSCULAR | Status: DC | PRN
  Administered 2014-09-16 (×4): 80 ug via INTRAVENOUS

## 2014-09-16 MED ORDER — SERTRALINE HCL 50 MG PO TABS
50.0000 mg | ORAL_TABLET | Freq: Every day | ORAL | Status: DC
  Administered 2014-09-16 – 2014-09-17 (×2): 50 mg via ORAL
  Filled 2014-09-16 (×3): qty 1

## 2014-09-16 MED ORDER — NALBUPHINE HCL 10 MG/ML IJ SOLN
5.0000 mg | INTRAMUSCULAR | Status: DC | PRN
  Administered 2014-09-16: 5 mg via SUBCUTANEOUS

## 2014-09-16 MED ORDER — MEPERIDINE HCL 25 MG/ML IJ SOLN: 6.2500 mg | INTRAMUSCULAR | Status: DC | PRN

## 2014-09-16 MED ORDER — TERBUTALINE SULFATE 1 MG/ML IJ SOLN
0.2500 mg | Freq: Once | INTRAMUSCULAR | Status: AC
  Administered 2014-09-16: 0.25 mg via SUBCUTANEOUS

## 2014-09-16 MED ORDER — LANOLIN HYDROUS EX OINT: 1.0000 "application " | TOPICAL_OINTMENT | CUTANEOUS | Status: DC | PRN

## 2014-09-16 MED ORDER — OXYCODONE-ACETAMINOPHEN 5-325 MG PO TABS
2.0000 | ORAL_TABLET | ORAL | Status: DC | PRN
  Administered 2014-09-17: 2 via ORAL
  Filled 2014-09-16: qty 2

## 2014-09-16 MED ORDER — PHENYLEPHRINE 40 MCG/ML (10ML) SYRINGE FOR IV PUSH (FOR BLOOD PRESSURE SUPPORT)
PREFILLED_SYRINGE | INTRAVENOUS | Status: AC
  Filled 2014-09-16: qty 20

## 2014-09-16 MED ORDER — BUPIVACAINE HCL (PF) 0.25 % IJ SOLN
INTRAMUSCULAR | Status: AC
  Filled 2014-09-16: qty 30

## 2014-09-16 MED ORDER — FENTANYL CITRATE 0.05 MG/ML IJ SOLN
INTRAMUSCULAR | Status: AC
  Administered 2014-09-16: 50 ug via INTRAVENOUS
  Filled 2014-09-16: qty 2

## 2014-09-16 MED ORDER — KETOROLAC TROMETHAMINE 30 MG/ML IJ SOLN
30.0000 mg | Freq: Four times a day (QID) | INTRAMUSCULAR | Status: DC | PRN
  Administered 2014-09-16: 30 mg via INTRAMUSCULAR

## 2014-09-16 MED ORDER — MENTHOL 3 MG MT LOZG
1.0000 | LOZENGE | OROMUCOSAL | Status: DC | PRN
  Filled 2014-09-16: qty 9

## 2014-09-16 MED ORDER — PNEUMOCOCCAL VAC POLYVALENT 25 MCG/0.5ML IJ INJ
0.5000 mL | INJECTION | INTRAMUSCULAR | Status: DC
  Filled 2014-09-16: qty 0.5

## 2014-09-16 MED ORDER — BUPIVACAINE HCL (PF) 0.25 % IJ SOLN
INTRAMUSCULAR | Status: DC | PRN
  Administered 2014-09-16: 30 mL

## 2014-09-16 MED ORDER — FENTANYL CITRATE 0.05 MG/ML IJ SOLN
INTRAMUSCULAR | Status: AC
  Filled 2014-09-16: qty 2

## 2014-09-16 MED ORDER — SODIUM CHLORIDE 0.9 % IJ SOLN: 3.0000 mL | INTRAMUSCULAR | Status: DC | PRN

## 2014-09-16 MED ORDER — METOCLOPRAMIDE HCL 5 MG/ML IJ SOLN
INTRAMUSCULAR | Status: AC
  Filled 2014-09-16: qty 2

## 2014-09-16 MED ORDER — PRENATAL MULTIVITAMIN CH
1.0000 | ORAL_TABLET | Freq: Every day | ORAL | Status: DC
Start: 2014-09-16 — End: 2014-09-17
  Administered 2014-09-16 – 2014-09-17 (×2): 1 via ORAL
  Filled 2014-09-16 (×4): qty 1

## 2014-09-16 MED ORDER — DIPHENHYDRAMINE HCL 50 MG/ML IJ SOLN
INTRAMUSCULAR | Status: DC | PRN
  Administered 2014-09-16: 25 mg via INTRAVENOUS

## 2014-09-16 MED ORDER — LIDOCAINE HCL 1 % IJ SOLN
0.0000 mL | Freq: Once | INTRAMUSCULAR | Status: AC | PRN
  Filled 2014-09-16: qty 20

## 2014-09-16 MED ORDER — SCOPOLAMINE 1 MG/3DAYS TD PT72
MEDICATED_PATCH | TRANSDERMAL | Status: DC | PRN
  Administered 2014-09-16: 1 via TRANSDERMAL

## 2014-09-16 MED ORDER — OXYCODONE-ACETAMINOPHEN 5-325 MG PO TABS
1.0000 | ORAL_TABLET | ORAL | Status: DC | PRN
  Administered 2014-09-17: 1 via ORAL
  Filled 2014-09-16 (×2): qty 1

## 2014-09-16 MED ORDER — LACTATED RINGERS IV SOLN
INTRAVENOUS | Status: DC | PRN
  Administered 2014-09-16 (×4): via INTRAVENOUS

## 2014-09-16 MED ORDER — ONDANSETRON HCL 4 MG/2ML IJ SOLN
4.0000 mg | INTRAMUSCULAR | Status: DC | PRN
Start: 2014-09-16 — End: 2014-09-17

## 2014-09-16 MED ORDER — SIMETHICONE 80 MG PO CHEW
80.0000 mg | CHEWABLE_TABLET | Freq: Three times a day (TID) | ORAL | Status: DC
  Administered 2014-09-16 – 2014-09-17 (×4): 80 mg via ORAL
  Filled 2014-09-16 (×11): qty 1

## 2014-09-16 MED ORDER — LACTATED RINGERS IV SOLN
INTRAVENOUS | Status: DC
  Administered 2014-09-16: 06:00:00 via INTRAVENOUS

## 2014-09-16 MED ORDER — PNEUMOCOCCAL VAC POLYVALENT 25 MCG/0.5ML IJ INJ
0.5000 mL | INJECTION | Freq: Once | INTRAMUSCULAR | Status: AC
  Administered 2014-09-17: 0.5 mL via INTRAMUSCULAR
  Filled 2014-09-16: qty 0.5

## 2014-09-16 MED ORDER — KETOROLAC TROMETHAMINE 30 MG/ML IJ SOLN
INTRAMUSCULAR | Status: AC
  Filled 2014-09-16: qty 1

## 2014-09-16 MED ORDER — SENNOSIDES-DOCUSATE SODIUM 8.6-50 MG PO TABS
2.0000 | ORAL_TABLET | ORAL | Status: DC
  Administered 2014-09-17: 2 via ORAL
  Filled 2014-09-16 (×3): qty 2

## 2014-09-16 MED ORDER — DIPHENHYDRAMINE HCL 25 MG PO CAPS
25.0000 mg | ORAL_CAPSULE | ORAL | Status: DC | PRN
  Administered 2014-09-16 (×2): 25 mg via ORAL
  Filled 2014-09-16 (×2): qty 1

## 2014-09-16 MED ORDER — HYDROMORPHONE HCL 1 MG/ML IJ SOLN
0.5000 mg | Freq: Once | INTRAMUSCULAR | Status: AC
  Administered 2014-09-16: 0.5 mg via INTRAVENOUS
  Filled 2014-09-16: qty 1

## 2014-09-16 MED ORDER — ONDANSETRON HCL 4 MG PO TABS: 4.0000 mg | ORAL_TABLET | ORAL | Status: DC | PRN

## 2014-09-16 MED ORDER — PHENYLEPHRINE 8 MG IN D5W 100 ML (0.08MG/ML) PREMIX OPTIME
INJECTION | INTRAVENOUS | Status: AC
  Filled 2014-09-16: qty 100

## 2014-09-16 MED ORDER — MORPHINE SULFATE 0.5 MG/ML IJ SOLN
INTRAMUSCULAR | Status: AC
  Filled 2014-09-16: qty 10

## 2014-09-16 MED ORDER — LACTATED RINGERS IV SOLN: INTRAVENOUS | Status: DC

## 2014-09-16 MED ORDER — TETANUS-DIPHTH-ACELL PERTUSSIS 5-2.5-18.5 LF-MCG/0.5 IM SUSP
0.5000 mL | Freq: Once | INTRAMUSCULAR | Status: DC
  Filled 2014-09-16: qty 0.5

## 2014-09-16 MED ORDER — ETONOGESTREL 68 MG ~~LOC~~ IMPL
68.0000 mg | DRUG_IMPLANT | Freq: Once | SUBCUTANEOUS | Status: AC
  Administered 2014-09-16: 68 mg via SUBCUTANEOUS
  Filled 2014-09-16: qty 1

## 2014-09-16 SURGICAL SUPPLY — 34 items
BENZOIN TINCTURE PRP APPL 2/3 (GAUZE/BANDAGES/DRESSINGS) ×3 IMPLANT
CATH ROBINSON RED A/P 16FR (CATHETERS) IMPLANT
CLAMP CORD UMBIL (MISCELLANEOUS) ×3 IMPLANT
CLOSURE WOUND 1/2 X4 (GAUZE/BANDAGES/DRESSINGS) ×1
CLOTH BEACON ORANGE TIMEOUT ST (SAFETY) ×3 IMPLANT
DRAPE SHEET LG 3/4 BI-LAMINATE (DRAPES) ×3 IMPLANT
DRSG OPSITE POSTOP 4X10 (GAUZE/BANDAGES/DRESSINGS) ×3 IMPLANT
DURAPREP 26ML APPLICATOR (WOUND CARE) ×3 IMPLANT
ELECT REM PT RETURN 9FT ADLT (ELECTROSURGICAL) ×3
ELECTRODE REM PT RTRN 9FT ADLT (ELECTROSURGICAL) ×1 IMPLANT
EXTRACTOR VACUUM M CUP 4 TUBE (SUCTIONS) IMPLANT
EXTRACTOR VACUUM M CUP 4' TUBE (SUCTIONS)
GLOVE BIOGEL PI IND STRL 7.5 (GLOVE) ×2 IMPLANT
GLOVE BIOGEL PI INDICATOR 7.5 (GLOVE) ×4
GLOVE ECLIPSE 7.5 STRL STRAW (GLOVE) ×3 IMPLANT
GOWN STRL REUS W/TWL LRG LVL3 (GOWN DISPOSABLE) ×9 IMPLANT
KIT ABG SYR 3ML LUER SLIP (SYRINGE) IMPLANT
NEEDLE HYPO 22GX1.5 SAFETY (NEEDLE) ×3 IMPLANT
NEEDLE HYPO 25X5/8 SAFETYGLIDE (NEEDLE) IMPLANT
NS IRRIG 1000ML POUR BTL (IV SOLUTION) ×3 IMPLANT
PACK C SECTION WH (CUSTOM PROCEDURE TRAY) ×3 IMPLANT
PAD OB MATERNITY 4.3X12.25 (PERSONAL CARE ITEMS) ×3 IMPLANT
RTRCTR C-SECT PINK 25CM LRG (MISCELLANEOUS) IMPLANT
STRIP CLOSURE SKIN 1/2X4 (GAUZE/BANDAGES/DRESSINGS) ×2 IMPLANT
SUT MNCRL 0 VIOLET CTX 36 (SUTURE) IMPLANT
SUT MONOCRYL 0 CTX 36 (SUTURE)
SUT VIC AB 0 CTX 36 (SUTURE) ×6
SUT VIC AB 0 CTX36XBRD ANBCTRL (SUTURE) ×3 IMPLANT
SUT VIC AB 2-0 CT1 27 (SUTURE) ×2
SUT VIC AB 2-0 CT1 TAPERPNT 27 (SUTURE) ×1 IMPLANT
SUT VIC AB 4-0 KS 27 (SUTURE) ×3 IMPLANT
SYR 30ML LL (SYRINGE) ×3 IMPLANT
TOWEL OR 17X24 6PK STRL BLUE (TOWEL DISPOSABLE) ×3 IMPLANT
TRAY FOLEY CATH 14FR (SET/KITS/TRAYS/PACK) ×3 IMPLANT

## 2014-09-16 NOTE — Op Note (Addendum)
Carly ReadingJessica Dugue PROCEDURE DATE: 09/15/2014 - 09/16/2014  PREOPERATIVE DIAGNOSIS: Intrauterine pregnancy at  9279w1d weeks gestation; breech, labor, bicornuate uterus  POSTOPERATIVE DIAGNOSIS: The same  PROCEDURE: Primary Low Transverse Cesarean Section  SURGEON:  Dr. Candelaria CelesteJacob Stinson  ASSISTANT: none  INDICATIONS: [name] is a 19 y.o. G3P1011 at 6979w1d scheduled for cesarean section secondary to breech, labor.  The risks of cesarean section discussed with the patient included but were not limited to: bleeding which may require transfusion or reoperation; infection which may require antibiotics; injury to bowel, bladder, ureters or other surrounding organs; injury to the fetus; need for additional procedures including hysterectomy in the event of a life-threatening hemorrhage; placental abnormalities wth subsequent pregnancies, incisional problems, thromboembolic phenomenon and other postoperative/anesthesia complications. The patient concurred with the proposed plan, giving informed written consent for the procedure.    FINDINGS:  Viable female infant in breech presentation.  Apgars 8 and 9, weight, 5 pounds and 15 ounces.  Meconium stained amniotic fluid.  Intact placenta, three vessel cord.  Bicornuate uterus, normal fallopian tubes and ovaries bilaterally.  ANESTHESIA:    Spinal INTRAVENOUS FLUIDS:2300 ml ESTIMATED BLOOD LOSS: 550 ml URINE OUTPUT:  150 ml SPECIMENS: Placenta sent to L&D COMPLICATIONS: None immediate  PROCEDURE IN DETAIL:  The patient received intravenous antibiotics and had sequential compression devices applied to her lower extremities while in the preoperative area.  She was then taken to the operating room where spinal anesthesia was administered and was found to be adequate. She was then placed in a dorsal supine position with a leftward tilt, and prepped and draped in a sterile manner.  A foley catheter was placed into her bladder and attached to constant gravity, which  drained clear fluid throughout.  After an adequate timeout was performed, a Pfannenstiel skin incision was made with scalpel and carried through to the underlying layer of fascia. The fascia was incised in the midline and this incision was extended bilaterally using the Mayo scissors. Kocher clamps were applied to the superior aspect of the fascial incision and the underlying rectus muscles were dissected off bluntly and sharply with Mayo scissors. A similar process was carried out on the inferior aspect of the facial incision. The rectus muscles were separated in the midline bluntly and the peritoneum was entered bluntly. An Alexis retractor was placed to aid in visualization of the uterus.  Attention was turned to the lower uterine segment where a transverse hysterotomy was made with a scalpel and extended bilaterally bluntly revealing meconium stained amniotic fluid and the baby in a complete breech position. The infant was successfully delivered, and cord was clamped and cut and infant was handed over to awaiting neonatology team. Uterine massage was then administered and the placenta delivered intact with three-vessel cord. The uterus was then cleared of clot and debris.  Upon examination, the uterus was shown to be bicornuate, with the right side being gravid.  The hysterotomy was closed with 0 Vicryl in a running locked fashion, and an imbricating layer was also placed with a 0 Vicryl. Overall, excellent hemostasis was noted. The abdomen and the pelvis were cleared of all clot and debris and the Jon Gillslexis was removed. Hemostasis was confirmed on all surfaces.  The peritoneum was reapproximated using 2-0 vicryl running stitches. The fascia was then closed using 0 Vicryl in a running fashion.  The skin was closed with 4-0 vicryl. Steri strips, honeycomb dressing and a pressure dressing was placed over the skin incision.  The patient tolerated the procedure  well. Sponge, lap, instrument and needle counts were  correct x 2. She was taken to the recovery room in stable condition.    STINSON, JACOB JEHIEL DO 09/16/2014 1:40 AM

## 2014-09-16 NOTE — Progress Notes (Signed)
Lab called to notify of orders for blood work to be done stat

## 2014-09-16 NOTE — Anesthesia Preprocedure Evaluation (Signed)
Anesthesia Evaluation  Patient identified by MRN, date of birth, ID band Patient awake    Reviewed: Allergy & Precautions, NPO status , Patient's Chart, lab work & pertinent test results  Airway Mallampati: III  TM Distance: >3 FB Neck ROM: Full    Dental no notable dental hx. (+) Teeth Intact   Pulmonary Current Smoker,  breath sounds clear to auscultation  Pulmonary exam normal       Cardiovascular negative cardio ROS  Rhythm:Regular Rate:Normal     Neuro/Psych  Headaches, negative psych ROS   GI/Hepatic Neg liver ROS, GERD-  ,  Endo/Other  negative endocrine ROS  Renal/GU negative Renal ROS     Musculoskeletal negative musculoskeletal ROS (+)   Abdominal   Peds  Hematology  (+) anemia ,   Anesthesia Other Findings   Reproductive/Obstetrics (+) Pregnancy Breech presentation Bicornuate uterus                             Anesthesia Physical  Anesthesia Plan  ASA: II and emergent  Anesthesia Plan: Spinal   Post-op Pain Management:    Induction:   Airway Management Planned: Natural Airway  Additional Equipment:   Intra-op Plan:   Post-operative Plan:   Informed Consent: I have reviewed the patients History and Physical, chart, labs and discussed the procedure including the risks, benefits and alternatives for the proposed anesthesia with the patient or authorized representative who has indicated his/her understanding and acceptance.     Plan Discussed with: Anesthesiologist, CRNA and Surgeon  Anesthesia Plan Comments:         Anesthesia Quick Evaluation

## 2014-09-16 NOTE — Progress Notes (Signed)
Subjective: Postpartum Day 0: Cesarean Delivery [name] is a 18y/o [id number] with a history of a bicornate uterus s/p pLTCS @0042  on 09/16/14 due to a breech presentation. She reports mild itching overnight. She is hemodynamically stable. Pain is under control. She has a foley in place and has had no BM without flatus as of yet. After discussion with the patient, she has opted towards Nexplanon as her form of contraception. Feeding: Bottle.        Objective: Vital signs in last 24 hours: Temp:  [97.6 F (36.4 C)-99.4 F (37.4 C)] 98.5 F (36.9 C) (02/05 0630) Pulse Rate:  [71-131] 71 (02/05 0630) Resp:  [16-25] 18 (02/05 0630) BP: (105-137)/(57-109) 118/80 mmHg (02/05 0630) SpO2:  [95 %-100 %] 97 % (02/05 0630) Weight:  [65.318 kg (144 lb)] 65.318 kg (144 lb) (02/05 0005)  Physical Exam:  General: alert, cooperative and no distress Lochia: appropriate Uterine Fundus: firm Incision: no significant drainage, no significant erythema DVT Evaluation: Negative Homan's sign. No cords or calf tenderness.   Recent Labs  09/15/14 2355  HGB 10.9*  HCT 33.2*    Assessment/Plan: Status post Cesarean section. Doing well postoperatively.  Continue current care. Schedule Nexplanon implant   [name], [name]  09/16/2014, 8:38 AM  I have seen and examined this patient and agree the above assessment. [name],[name] 09/20/2014 9:08 AM

## 2014-09-16 NOTE — Addendum Note (Signed)
Addendum  created 09/16/14 1242 by Lincoln BrighamAngela Draughon Montgomery Rothlisberger, CRNA   Modules edited: Notes Section   Notes Section:  File: [id number]

## 2014-09-16 NOTE — Anesthesia Postprocedure Evaluation (Signed)
  Anesthesia Post-op Note  Patient: Christia ReadingJessica Templin  Procedure(s) Performed: Procedure(s): CESAREAN SECTION (N/A)  Patient Location: PACU  Anesthesia Type:Spinal  Level of Consciousness: awake, alert  and oriented  Airway and Oxygen Therapy: Patient Spontanous Breathing  Post-op Pain: none  Post-op Assessment: Post-op Vital signs reviewed, Patient's Cardiovascular Status Stable, Respiratory Function Stable, Patent Airway, NAUSEA AND VOMITING PRESENT, Pain level controlled, No headache, No backache, No residual numbness and No residual motor weakness  Post-op Vital Signs: Reviewed and stable  Last Vitals:  Filed Vitals:   09/16/14 0215  BP: 108/70  Pulse: 79  Temp:   Resp: 22    Complications: No apparent anesthesia complications

## 2014-09-16 NOTE — Progress Notes (Signed)
  HPI:  [name] is a 19 y.o. year old Caucasian female desiring Nexplanon insertion Risks/benefits/side effects of Nexplanon have been discussed and her questions have been answered.  Specifically, a failure rate of 08/998 has been reported, with an increased failure rate if pt takes St. John's Wort and/or antiseizure medicaitons.  [name] is aware of the common side effect of irregular bleeding, which the incidence of decreases over time.   Past Medical History: Past Medical History  Diagnosis Date  . Migraine     Typically takes Ibuprofen;lie down in dark room  . Bicornate uterus     Past Surgical History: Past Surgical History  Procedure Laterality Date  . No past surgeries      Family History: Family History  Problem Relation Age of Onset  . Heart disease Paternal Grandfather   . Brain cancer Paternal Grandfather   . Hypertension Paternal Grandfather   . Stroke Paternal Grandfather   . Asthma Mother   . Diabetes Mother   . Migraines Mother   . Kidney disease Cousin     Kidney infections:Maternal  . Migraines Maternal Grandmother     Social History: History  Substance Use Topics  . Smoking status: Current Every Day Smoker -- 0.50 packs/day    Types: Cigarettes    Last Attempt to Quit: 07/12/2012  . Smokeless tobacco: Never Used  . Alcohol Use: No    Allergies:  Allergies  Allergen Reactions  . Shrimp [Shellfish Allergy] Shortness Of Breath and Swelling  . Strawberry Anaphylaxis    Rash inside mouth; throat swells       Her left arm, approximatly 4 inches proximal from the elbow, was cleansed with alcohol and anesthetized with 2cc of 2% Lidocaine.  The area was cleansed again and the Nexplanon was inserted without difficulty.  A pressure bandage was applied.  Pt was instructed to remove pressure bandage in a few hours, and keep insertion site covered with a bandaid for 3 days.   CRESENZO-DISHMAN,Jeny Nield 09/16/2014 8:12 AM

## 2014-09-16 NOTE — Transfer of Care (Signed)
Immediate Anesthesia Transfer of Care Note  Patient: Carly ReadingJessica Bruna  Procedure(s) Performed: Procedure(s): CESAREAN SECTION (N/A)  Patient Location: PACU  Anesthesia Type:Spinal  Level of Consciousness: awake, alert  and oriented  Airway & Oxygen Therapy: Patient Spontanous Breathing  Post-op Assessment: Report given to RN and Post -op Vital signs reviewed and stable  Post vital signs: Reviewed and stable  Last Vitals:  Filed Vitals:   09/16/14 0014  BP: 136/94  Pulse: 131  Temp:   Resp:     Complications: No apparent anesthesia complications

## 2014-09-16 NOTE — Anesthesia Postprocedure Evaluation (Signed)
Anesthesia Post Note  Patient: Carly ReadingJessica Santos  Procedure(s) Performed: Procedure(s) (LRB): CESAREAN SECTION (N/A)  Anesthesia type: Spinal  Patient location: Mother/Baby  Post pain: Pain level controlled  Post assessment: Post-op Vital signs reviewed  Last Vitals:  Filed Vitals:   09/16/14 0630  BP: 118/80  Pulse: 71  Temp: 36.9 C  Resp: 18    Post vital signs: Reviewed  Level of consciousness:alert  Complications: No apparent anesthesia complications

## 2014-09-16 NOTE — Progress Notes (Signed)
Clinical Social Work Department PSYCHOSOCIAL ASSESSMENT - MATERNAL/CHILD 09/16/2014  Patient:  [name], [name]  Account Number:  [id number]  Admit Date:  09/15/2014  Ardine Eng Name:   Alfonso Ellis    Clinical Social Worker:  Carly Piedra, LCSW   Date/Time:  09/16/2014 03:30 PM  Date Referred:  09/16/2014   Referral source  CN     Referred reason  Adoption  Other - See comment  [facility]   Other referral source:   Marijuana use    I:  FAMILY / [place] legal guardian:  PARENT  Guardian - Name Guardian - Age Guardian - Address  [name] [address], [place], [place] [address]  [name]  same   Other household support members/support persons Name Relationship DOB  [name] DAUGHTER 07/23/13   Other support:   MOB states FOB and PGM are her only support people.    II  PSYCHOSOCIAL DATA Information Source:  Patient Interview  Occupational hygienist Employment:   FOB works at [employer] resources:  [employer] If [place]:  [employer] / Grade:   Maternity Care Coordinator / Child Services Coordination / Early Interventions:  Cultural issues impacting care:   None stated    III  STRENGTHS  Strength comment:  MOB feels confident in her adoption plan   IV  RISK FACTORS AND CURRENT PROBLEMS Current Problem:  YES   Risk Factor & Current Problem Patient Issue Family Issue Risk Factor / Current Problem Comment  Mental Illness Y N Depression-high risk for PPD  Other - See comment Y N marijuana use         V  SOCIAL WORK ASSESSMENT  CSW met with MOB in her first floor room/131 to complete assessment for [facility], marijuana use and private adoption.  MOB was quiet, but pleasant and receptive to CSW intervention.  MOB was alone in her room and holding baby.  She reports feeling well and states baby is also doing well.  She reports plans for FOB's mother, [name], to adopt the  baby.  MOB was choking back tears throughout entire assessment.  CSW asked her to speak about her decision to make an adoption plan to ensure that she feels comfortable and not pressured by anyone to make this decision.  MOB states she and FOB have a 20 year old and do not have the finances to take care of another baby.  She states she is confident in her decision and not persuaded by anyone.  CSW commended her for her decision, and noted the emotion she was trying to conceal.  CSW encouraged her to allow herself to be emotional.  She began to cry, but stated that she was "okay."  CSW recommends outpatient counseling to help her cope with her emotions surrounding this decision.  She states she is open to counseling and remarked that she has "dealt with depression all her life."  She states often feeling sad, hopeless, and isolated.  She states she has had a hard life.  She also states FOB is struggling with the decision to allow PGM to adopt the baby, but he too knows it is the best decision for the baby and for their family.  CSW provided MOB with a list of counselors in her area who accept Medicaid.  She states she is capable of making a call to the counselor of her choice, and denies need for CSW to make a referral for her.  CSW also discussed the possibility of starting an antidepressant.  She is also interested in this, however, states she has not ever been on medication for depression.  CSW spoke to on call OB/Dr. Roselie Awkward about the possibility of starting a low dose antidepressant.  CSW provided MOB with information for medication management follow up at [facility] in [place].  MOB seemed appreciative for CSW's concern for her wellbeing and for the behavioral health information.  CSW provided education about PPD signs and symptoms.  CSW also provided information on adoptions and inquired about how far the family is in the process.  MOB states she does not know and that CSW will have to speak with  PGM.  CSW informed MOB of hospital drug screen policy due to [facility] and informed MOB of baby's positive UDS for marijuana.  MOB appeared embarrassed and again began to hold back tears.  CSW again gave permission for MOB to cry.  CSW acknowledged the stress of the situation and why CSW had to discuss this issue.  MOB states she smoked marijuana due to back pain in pregnancy.  She denies use during her first pregnancy and prior to this pregnancy.  CSW informed her of mandated report to Child Protective Services and encouraged MOB to have a conversation with PGM, as she will also be interviewed by CPS.  MOB stated understanding.  CSW made report to [facility].  Later, MOB called CSW to inform of PGM's arrival.  PGM states she has not hired an Forensic psychologist, but will be filing "adoption papers" at the courthouse after discharge.  CSW explained that without legal paperwork, the baby will not be allowed to be discharged to [facility], and will have to be discharged to [facility].  PGM states she has paperwork from the courts at home and she will bring it to the hospital tomorrow.  CSW left report for weekend CSW to evaluate further prior to discharge.  MOB informed PGM of CPS report and CSW answered questions.  PGM stated understanding.  PGM asked if marijuana use affects baby's health and development.  CSW encouraged her to ask this question to baby's pediatrician.  PGM thanked CSW for discussing emotional health and support with MOB and thanked CSW for outpatient counseling information.  MOB again thanked CSW.  They report no further questions or needs at this time and understand that weekend CSW will follow up with them.  VI SOCIAL WORK PLAN Social Work Therapist, art  Psychosocial Support/Ongoing Assessment of Needs   Type of pt/family education:   Importance of mental health care, especially in light of adoption plan.  Signs and symptoms of PPD  Discussion of benefits of antidepressant medication  Hospital  drug screen policy/baby's positive UDS for [facility]   If child protective services report - county:  [facility] If child protective services report - date:  09/16/2014 Information/referral to community resources comment:   List of outpatient counselors in [place]  List of psychiatrists in [place] for medication management   Other social work plan:   CSW will monitor MDS result

## 2014-09-17 LAB — CBC
HCT: 27.5 % — ABNORMAL LOW (ref 36.0–46.0)
Hemoglobin: 9 g/dL — ABNORMAL LOW (ref 12.0–15.0)
MCH: 26.3 pg (ref 26.0–34.0)
MCHC: 32.7 g/dL (ref 30.0–36.0)
MCV: 80.4 fL (ref 78.0–100.0)
Platelets: 165 10*3/uL (ref 150–400)
RBC: 3.42 MIL/uL — AB (ref 3.87–5.11)
RDW: 15.5 % (ref 11.5–15.5)
WBC: 18.7 10*3/uL — ABNORMAL HIGH (ref 4.0–10.5)

## 2014-09-17 LAB — BIRTH TISSUE RECOVERY COLLECTION (PLACENTA DONATION)

## 2014-09-17 LAB — RPR: RPR Ser Ql: NONREACTIVE

## 2014-09-17 MED ORDER — SERTRALINE HCL 50 MG PO TABS: 50.0000 mg | ORAL_TABLET | Freq: Every day | ORAL | Status: DC

## 2014-09-17 MED ORDER — OXYCODONE-ACETAMINOPHEN 5-325 MG PO TABS: 2.0000 | ORAL_TABLET | ORAL | Status: DC | PRN

## 2014-09-17 NOTE — Discharge Summary (Signed)
Obstetric Discharge Summary Reason for Admission: onset of labor Prenatal Procedures: ultrasound Intrapartum Procedures: cesarean: low cervical, transverse Postpartum Procedures: none Complications-Operative and Postpartum: none HEMOGLOBIN  Date Value Ref Range Status  09/17/2014 9.0* 12.0 - 15.0 g/dL Final    Comment:    REPEATED TO VERIFY DELTA CHECK NOTED    HCT  Date Value Ref Range Status  09/17/2014 27.5* 36.0 - 46.0 % Final   Patient presented to L&D in active labor dilated to 6.5cm and found to be breech.  Urgent cesarean performed after terbutaline given.  Today doing well, tolerating PO, pain well controlled.  Delivered shortly after midnight yesterday and requesting to go home today.  Physical Exam:  General: alert and cooperative Lochia: appropriate Uterine Fundus: firm, wound dressed Incision: not visualized DVT Evaluation: No evidence of DVT seen on physical exam.  Discharge Diagnoses: Term Pregnancy-delivered, breech presentation, bicornuate uterus, primary low transverse cesarean section  Discharge Information: Date: 09/17/2014 Activity: pelvic rest and advance activity slowly, no heavy lifting Diet: routine Medications: Percocet Condition: stable Instructions: refer to practice specific booklet Discharge to: home Follow-up Information    Follow up In 4 weeks.      Newborn Data: Live born female  Birth Weight: 5 lb 15.2 oz (2699 g) APGAR: 8, 9  Home with with mother in law, pt reports mother in law will adopt baby.  Tidus Upchurch ROCIO 09/17/2014, 11:44 AM

## 2014-09-17 NOTE — Discharge Instructions (Signed)

## 2014-09-17 NOTE — Progress Notes (Signed)
Follow up visit with mother prior to discharge to discuss adoption plans.  Informed that they decided to complete the adoption paperwork independent of the hospital.  Newborn was discharged to mother.   

## 2014-09-17 NOTE — Progress Notes (Signed)
Subjective: Postpartum Day 1: Cesarean Delivery Patient reports incisional pain, tolerating PO and no problems voiding.    Objective: Vital signs in last 24 hours: Temp:  [98 F (36.7 C)-99.2 F (37.3 C)] 98.4 F (36.9 C) (02/06 0617) Pulse Rate:  [70-91] 84 (02/06 0617) Resp:  [18] 18 (02/06 0617) BP: (92-118)/(40-79) 111/73 mmHg (02/06 0617) SpO2:  [97 %-99 %] 99 % (02/06 0035)  Physical Exam:  General: alert, cooperative and no distress Lochia: appropriate Uterine Fundus: firm Incision: healing well, no significant drainage DVT Evaluation: No evidence of DVT seen on physical exam.   Recent Labs  09/15/14 2355 09/17/14 0547  HGB 10.9* 9.0*  HCT 33.2* 27.5*    Assessment/Plan: Status post Cesarean section. Doing well postoperatively.  Continue current care. Bottle, nexplanon placed  McKessonMoss, Roni Scow 09/17/2014, 7:24 AM

## 2014-09-19 ENCOUNTER — Encounter (HOSPITAL_COMMUNITY): Payer: Self-pay | Admitting: Family Medicine

## 2014-09-19 ENCOUNTER — Encounter: Payer: Medicaid Other | Admitting: Obstetrics and Gynecology

## 2014-09-19 LAB — HIV ANTIBODY (ROUTINE TESTING W REFLEX): HIV Screen 4th Generation wRfx: NONREACTIVE

## 2014-09-20 ENCOUNTER — Encounter: Payer: Self-pay | Admitting: Family Medicine

## 2014-09-21 ENCOUNTER — Telehealth: Payer: Self-pay | Admitting: *Deleted

## 2014-09-21 DIAGNOSIS — G8918 Other acute postprocedural pain: Secondary | ICD-10-CM

## 2014-09-21 MED ORDER — IBUPROFEN 600 MG PO TABS: 600.0000 mg | ORAL_TABLET | Freq: Four times a day (QID) | ORAL | Status: DC | PRN

## 2014-09-21 NOTE — Telephone Encounter (Signed)
Lechelle's mother left a message she was calling for [name]. She states Shanda BumpsJessica ran out of percocet- that she did not get enough and was supposed to have ibuprofen too.  Called Shanda BumpsJessica and she says her pain has been =8 and has been taking 2 percocet at a time which relieves her pain completely. We discussed normally 30 percocet should be enough for after a c/section so there is a concern is there any wound problems. She states she has not taken her dressing off yet, but will today. I informed her I cannot send in  A prescription of percocet, but can send in for ibuprofen 600mg  every 6 hours as needed.I also disucssed with Dr. Jolayne Pantheronstant.   I instructed her to come to MAU if pain unrelieved or worsens, if wound looks edematous, red, open, has discharge or she has fever. She voiced understanding.

## 2014-10-14 ENCOUNTER — Encounter: Payer: Self-pay | Admitting: General Practice

## 2014-10-28 ENCOUNTER — Ambulatory Visit: Payer: Medicaid Other | Admitting: Family Medicine

## 2015-10-09 IMAGING — US US OB DETAIL+14 WK
1 series · 12 of 28 positions shown · non-contrast
Comparison: none

[Series 1: us ob comp +14 wk mfm · 12 of 54 slices shown]
[im 2/54]
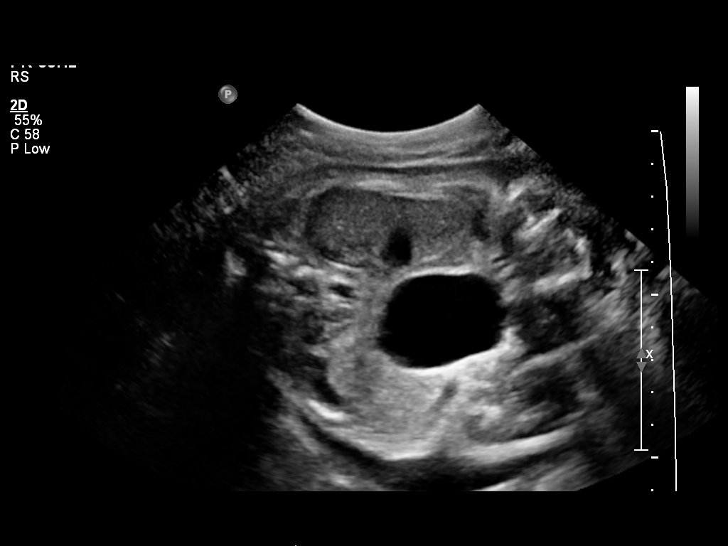
[im 6/54]
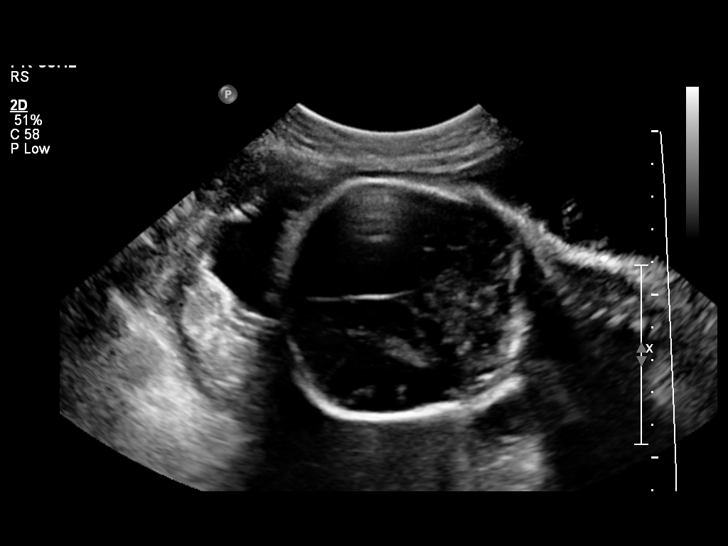
[im 10/54]
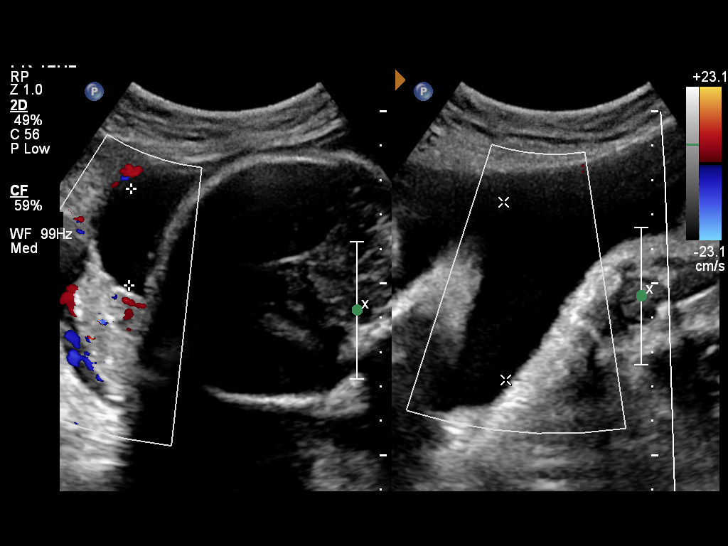
[im 16/54]
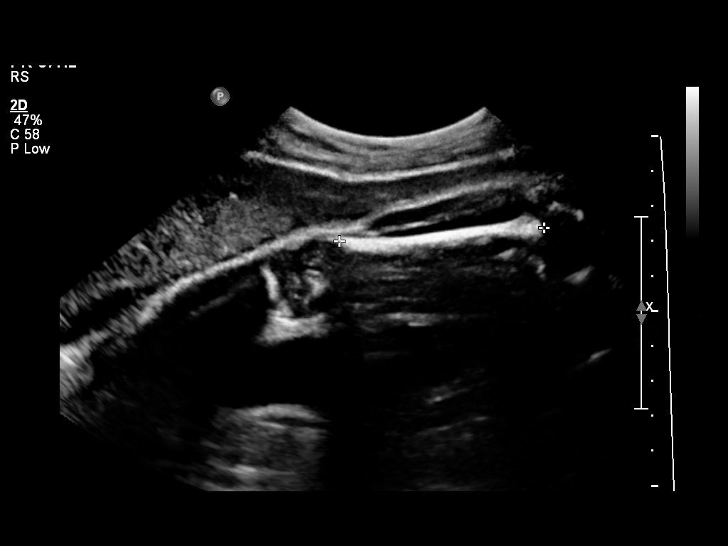
[im 20/54]
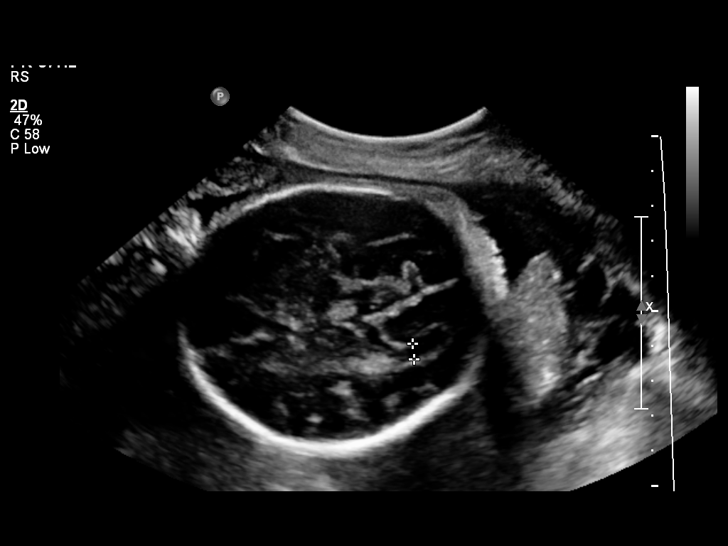
[im 24/54]
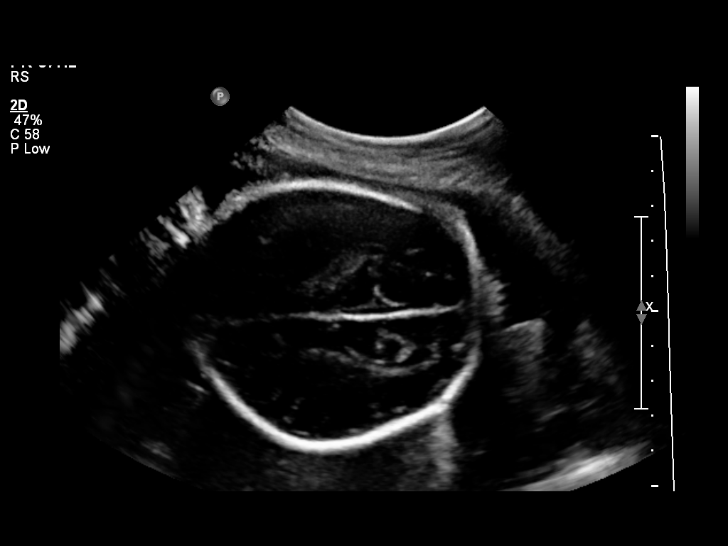
[im 30/54]
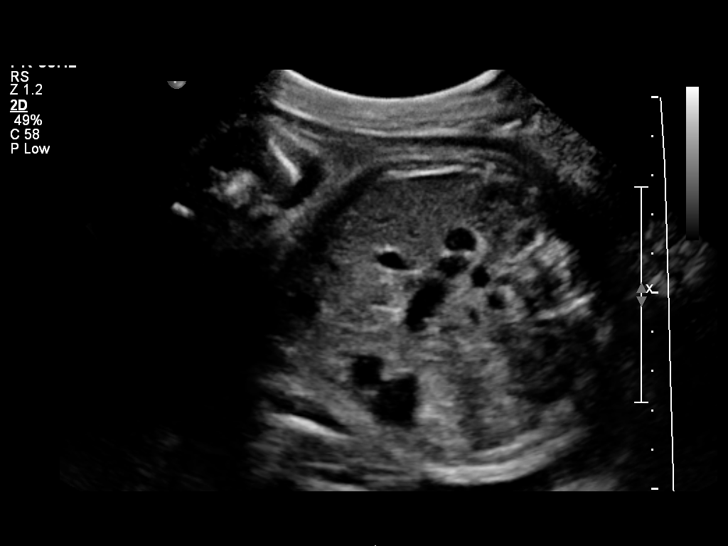
[im 34/54]
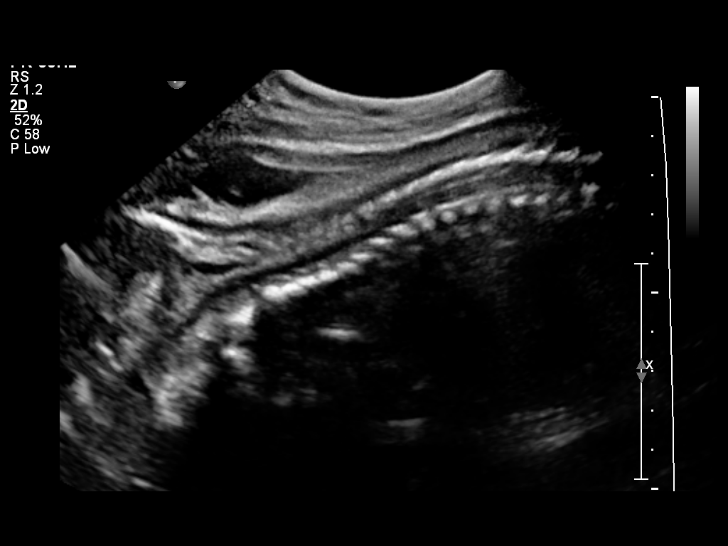
[im 38/54]
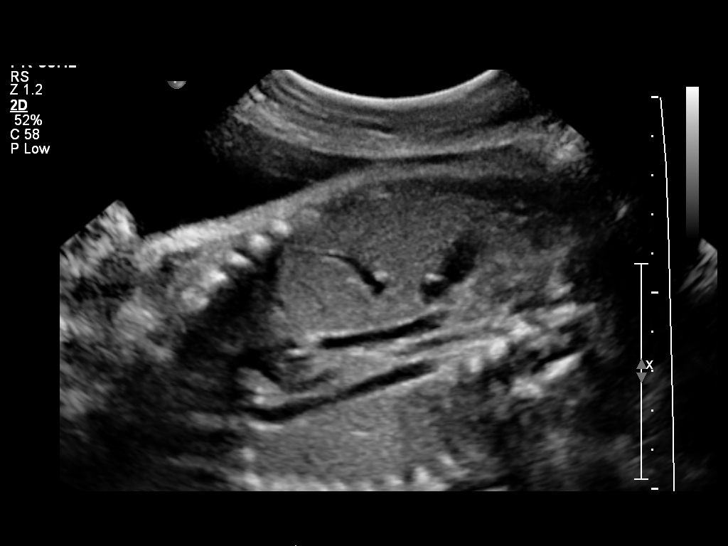
[im 44/54]
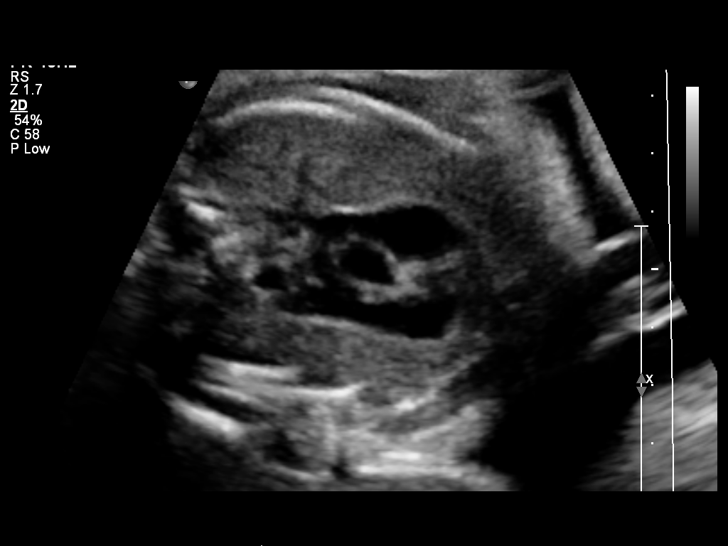
[im 48/54]
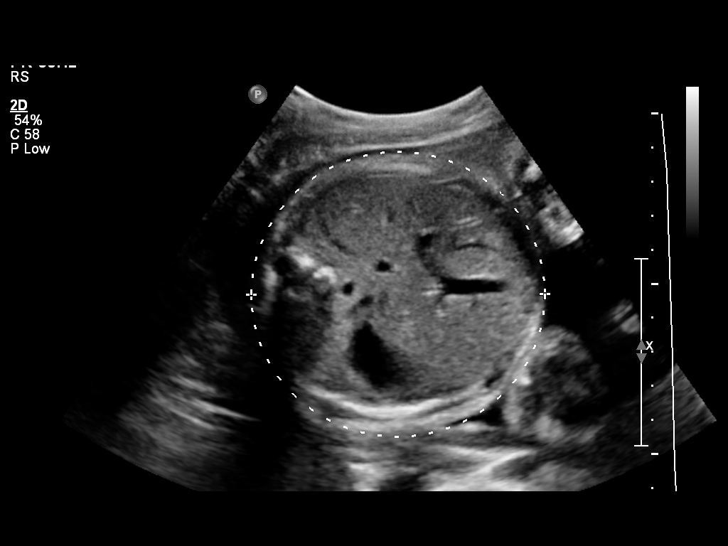
[im 52/54]
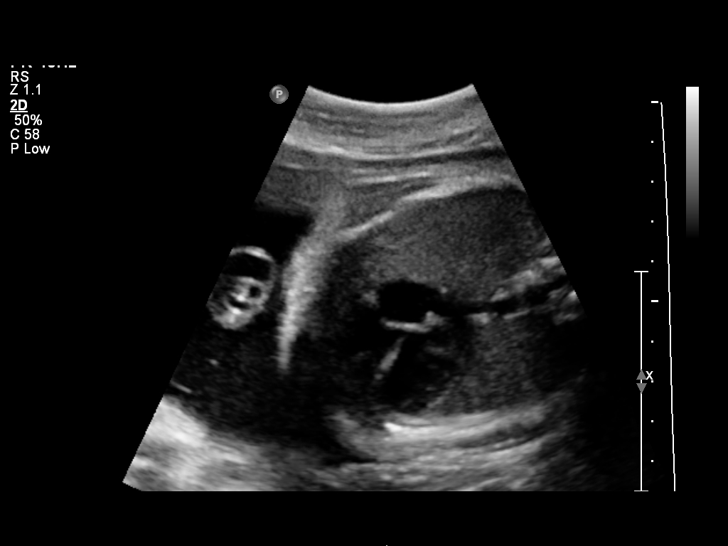

[12 of 28 positions shown; findings below may reference images not displayed]

OBSTETRICS REPORT
                      (Signed Final 07/26/2014 [DATE])

Service(s) Provided

 US OB COMP + 14 WK                                    76805.1
Indications

 29 weeks gestation of pregnancy
 Cigarette smoker
 Detailed fetal anatomic survey                        Z36
Fetal Evaluation

 Num Of Fetuses:    1
 Fetal Heart Rate:  130                          bpm
 Cardiac Activity:  Observed
 Presentation:      Frank breech
 Placenta:          Posterior, above cervical
                    os
 P. Cord            Visualized
 Insertion:

 Amniotic Fluid
 AFI FV:      Subjectively within normal limits
 AFI Sum:     14.6    cm       51  %Tile     Larg Pckt:    5.13  cm
 RUQ:   2.79    cm   RLQ:    5.13   cm    LUQ:   3.74    cm   LLQ:    2.94   cm
Biometry

 BPD:     71.6  mm     G. Age:  28w 5d                CI:        70.88   70 - 86
                                                      FL/HC:      21.0   19.2 -

 HC:       271  mm     G. Age:  29w 4d       15  %    HC/AC:      1.01   0.99 -

 AC:     267.4  mm     G. Age:  30w 6d       77  %    FL/BPD:     79.5   71 - 87
 FL:      56.9  mm     G. Age:  29w 6d       40  %    FL/AC:      21.3   20 - 24
 CER:     34.3  mm     G. Age:  29w 5d       49  %

 Est. FW:    1234  gm      3 lb 6 oz     62  %
Gestational Age

 LMP:           31w 2d        Date:  12/19/13                 EDD:   09/25/14
 U/S Today:     29w 5d                                        EDD:   10/06/14
 Best:          29w 5d     Det. By:  U/S (07/26/14)           EDD:   10/06/14
Anatomy
 Cranium:          Appears normal         Aortic Arch:      Appears normal
 Fetal Cavum:      Appears normal         Ductal Arch:      Not well visualized
 Ventricles:       Appears normal         Diaphragm:        Appears normal
 Choroid Plexus:   Appears normal         Stomach:          Appears normal, left
                                                            sided
 Cerebellum:       Appears normal         Abdomen:          Appears normal
 Posterior Fossa:  Appears normal         Abdominal Wall:   Not well visualized
 Nuchal Fold:      Not applicable (>20    Cord Vessels:     Appears normal (3
                   wks GA)                                  vessel cord)
 Face:             Not well visualized    Kidneys:          Appear normal
 Lips:             Appears normal         Bladder:          Appears normal
 Heart:            Appears normal         Spine:            Limited views
                   (4CH, axis, and                          appear normal
                   situs)
 RVOT:             Appears normal         Lower             Visualized
                                          Extremities:
 LVOT:             Appears normal         Upper             Visualized
                                          Extremities:

 Other:  Fetus appears to be a female. nb visualized. Technically difficult due
         to advanced GA and fetal position.
Targeted Anatomy

 Fetal Central Nervous System
 Cisterna Magna:
Cervix Uterus Adnexa

 Cervix:       Not visualized (advanced GA >92wks)

 Left Ovary:    No adnexal mass visualized.
 Right Ovary:   No adnexal mass visualized.
Impression

 SIUP at 29+5 weeks
 Normal detailed fetal anatomy; limited views of face, DA and
 CI
 Normal amniotic fluid volume
 EDC based on today's measurements
Recommendations

 Follow-up ultrasound in 
6 weeks to reassess interval growth

 questions or concerns.

## 2015-11-04 ENCOUNTER — Inpatient Hospital Stay (HOSPITAL_COMMUNITY)
Admission: AD | Admit: 2015-11-04 | Discharge: 2015-11-04 | Disposition: A | Discharge status: Home / Self Care | Payer: Medicaid Other | Source: Ambulatory Visit | Attending: Obstetrics & Gynecology | Admitting: Obstetrics & Gynecology

## 2015-11-04 ENCOUNTER — Encounter (HOSPITAL_COMMUNITY): Payer: Self-pay

## 2015-11-04 DIAGNOSIS — L03818 Cellulitis of other sites: Secondary | ICD-10-CM

## 2015-11-04 DIAGNOSIS — F17210 Nicotine dependence, cigarettes, uncomplicated: Secondary | ICD-10-CM | POA: Insufficient documentation

## 2015-11-04 DIAGNOSIS — Q513 Bicornate uterus: Secondary | ICD-10-CM | POA: Diagnosis not present

## 2015-11-04 DIAGNOSIS — N939 Abnormal uterine and vaginal bleeding, unspecified: Secondary | ICD-10-CM | POA: Insufficient documentation

## 2015-11-04 DIAGNOSIS — R1013 Epigastric pain: Secondary | ICD-10-CM | POA: Diagnosis not present

## 2015-11-04 DIAGNOSIS — K2970 Gastritis, unspecified, without bleeding: Secondary | ICD-10-CM | POA: Diagnosis not present

## 2015-11-04 DIAGNOSIS — Z3202 Encounter for pregnancy test, result negative: Secondary | ICD-10-CM | POA: Diagnosis not present

## 2015-11-04 DIAGNOSIS — N61 Mastitis without abscess: Secondary | ICD-10-CM | POA: Diagnosis not present

## 2015-11-04 DIAGNOSIS — K2100 Gastro-esophageal reflux disease with esophagitis, without bleeding: Secondary | ICD-10-CM

## 2015-11-04 DIAGNOSIS — K219 Gastro-esophageal reflux disease without esophagitis: Secondary | ICD-10-CM | POA: Insufficient documentation

## 2015-11-04 LAB — URINALYSIS, ROUTINE W REFLEX MICROSCOPIC
BILIRUBIN URINE: NEGATIVE
Glucose, UA: NEGATIVE mg/dL
KETONES UR: NEGATIVE mg/dL
Leukocytes, UA: NEGATIVE
NITRITE: NEGATIVE
PH: 5.5 (ref 5.0–8.0)
Protein, ur: NEGATIVE mg/dL
Specific Gravity, Urine: <1.005 — ABNORMAL LOW (ref 1.005–1.030)

## 2015-11-04 LAB — URINE MICROSCOPIC-ADD ON

## 2015-11-04 LAB — POCT PREGNANCY, URINE: Preg Test, Ur: NEGATIVE

## 2015-11-04 MED ORDER — SULFAMETHOXAZOLE-TRIMETHOPRIM 800-160 MG PO TABS: 1.0000 | ORAL_TABLET | Freq: Two times a day (BID) | ORAL | Status: DC

## 2015-11-04 MED ORDER — OMEPRAZOLE 20 MG PO CPDR: 20.0000 mg | DELAYED_RELEASE_CAPSULE | Freq: Every day | ORAL | Status: DC

## 2015-11-04 MED ORDER — GI COCKTAIL ~~LOC~~
30.0000 mL | Freq: Once | ORAL | Status: AC
  Administered 2015-11-04: 30 mL via ORAL
  Filled 2015-11-04: qty 30

## 2015-11-04 NOTE — MAU Note (Addendum)
Abd pains and feels like a baby is in there. Some vag bleeding today. Abd pains for 3 days. Have nexplanon. R nipple swollen and infected. No discharge but did see white bump with pus in it on breast. Nexplanon in for a yr and only has occ spotting. Did upt and was negative

## 2015-11-04 NOTE — MAU Provider Note (Signed)
History     CSN: [id number]  Arrival date and time: 11/04/15 1191   First Provider Initiated Contact with Patient 11/04/15 2043      Chief Complaint  Patient presents with  . Vaginal Bleeding   HPI Ms. [name] is a 20 y.o. [id number] who presents to MAU today with multiple complaints. Firstly the patient states that she has had right breast pain x 2 days. She noted what looked like a pimple on her nipple. She states that she "popped" that and then noted a small amount of pus from that area. Since then she has had more pain and swelling and the breast tissue has become erythematous. She is not currently, nor has she recently been breastfeeding. She denies fever, discharge from the area of concern or the nipple or bleeding from the area or nipple. The patient also has concern for pregnancy because she has Nexplanon and doesn't have regular periods and "feels something moving" in her abdomen like a baby. The patient also complains of epigastric pain and LUQ burning. She denies heartburn, N/V/D or constipation.   OB History    Gravida Para Term Preterm AB TAB SAB Ectopic Multiple Living   0 2      Past Medical History  Diagnosis Date  . Migraine     Typically takes Ibuprofen;lie down in dark room  . Bicornate uterus     Past Surgical History  Procedure Laterality Date  . No past surgeries    . Cesarean section N/A 09/16/2014    Procedure: CESAREAN SECTION;  Surgeon: Levie Heritage, DO;  Location: [facility];  Service: Obstetrics;  Laterality: N/A;    Family History  Problem Relation Age of Onset  . Heart disease Paternal Grandfather   . Brain cancer Paternal Grandfather   . Hypertension Paternal Grandfather   . Stroke Paternal Grandfather   . Asthma Mother   . Diabetes Mother   . Migraines Mother   . Kidney disease Cousin     Kidney infections:Maternal  . Migraines Maternal Grandmother     Social History  Substance Use Topics  . Smoking status: Current  Every Day Smoker -- 0.50 packs/day    Types: Cigarettes    Last Attempt to Quit: 07/12/2012  . Smokeless tobacco: Never Used  . Alcohol Use: No    Allergies:  Allergies  Allergen Reactions  . Shrimp [Shellfish Allergy] Shortness Of Breath and Swelling  . Strawberry Extract Anaphylaxis    Rash inside mouth; throat swells     Prescriptions prior to admission  Medication Sig Dispense Refill Last Dose  . acetaminophen (TYLENOL) 500 MG tablet Take 2 tablets (1,000 mg total) by mouth every 8 (eight) hours as needed for headache. 30 tablet 0 prn  . ibuprofen (ADVIL,MOTRIN) 600 MG tablet Take 1 tablet (600 mg total) by mouth every 6 (six) hours as needed. 30 tablet 1   . oxyCODONE-acetaminophen (PERCOCET/ROXICET) 5-325 MG per tablet Take 2 tablets by mouth every 4 (four) hours as needed (for pain scale equal to or greater than 7). 30 tablet 0   . sertraline (ZOLOFT) 50 MG tablet Take 1 tablet (50 mg total) by mouth daily. 30 tablet 12     Review of Systems  Constitutional: Negative for fever and malaise/fatigue.  Gastrointestinal: Positive for abdominal pain. Negative for nausea, vomiting, diarrhea and constipation.  Genitourinary:       + spotting + breast pain, redness  Skin: Negative for itching  and rash.   Physical Exam   Blood pressure 126/82, pulse 94, temperature 98.4 F (36.9 C), resp. rate 18, height 5' (1.524 m), weight 110 lb (49.896 kg), unknown if currently breastfeeding.  Physical Exam  Nursing note and vitals reviewed. Constitutional: She is oriented to person, place, and time. She appears well-developed and well-nourished. No distress.  HENT:  Head: Normocephalic and atraumatic.  Cardiovascular: Normal rate.   Respiratory: Effort normal. Right breast exhibits skin change (mild erythema noted at 10 o'clock, no firmness or fluctance). Right breast exhibits no inverted nipple, no mass, no nipple discharge and no tenderness. Breasts are symmetrical.  GI: Soft. She  exhibits no distension and no mass. There is tenderness (mild tenderness to palpation of the epigastric region). There is no rebound and no guarding.  Neurological: She is alert and oriented to person, place, and time.  Skin: Skin is warm and dry. No erythema.  Psychiatric: She has a normal mood and affect.    Results for orders placed or performed during the hospital encounter of 11/04/15 (from the past 24 hour(s))  Urinalysis, Routine w reflex microscopic (not at [facility])     Status: Abnormal   Collection Time: 11/04/15  8:20 PM  Result Value Ref Range   Color, Urine YELLOW YELLOW   APPearance CLEAR CLEAR   Specific Gravity, Urine <1.005 (L) 1.005 - 1.030   pH 5.5 5.0 - 8.0   Glucose, UA NEGATIVE NEGATIVE mg/dL   Hgb urine dipstick SMALL (A) NEGATIVE   Bilirubin Urine NEGATIVE NEGATIVE   Ketones, ur NEGATIVE NEGATIVE mg/dL   Protein, ur NEGATIVE NEGATIVE mg/dL   Nitrite NEGATIVE NEGATIVE   Leukocytes, UA NEGATIVE NEGATIVE  Urine microscopic-add on     Status: Abnormal   Collection Time: 11/04/15  8:20 PM  Result Value Ref Range   Squamous Epithelial / LPF 0-5 (A) NONE SEEN   WBC, UA 0-5 0 - 5 WBC/hpf   RBC / HPF 6-30 0 - 5 RBC/hpf   Bacteria, UA FEW (A) NONE SEEN  Pregnancy, urine POC     Status: None   Collection Time: 11/04/15  8:27 PM  Result Value Ref Range   Preg Test, Ur NEGATIVE NEGATIVE    MAU Course  Procedures None  MDM UPT - negative UA today GI Cocktail given  Assessment and Plan  A: GERD Gastritis Irregular bleeding with Nexplanon Negative pregnancy test Cellulitis of the breast, right  P: Discharge home Rx for Prilosec and Bactrim given to patient  Warning signs for worsening condition discussed Patient advised to follow-up with PCP if symptoms persist or worsen Contact information for [facility] given if breast symptoms persist or worsen Patient may return to MAU as needed or if her condition were to change or worsen   Marny LowensteinJulie N Wenzel,  PA-C  11/04/2015, 9:03 PM

## 2015-11-04 NOTE — Discharge Instructions (Signed)
Cellulitis Cellulitis is an infection of the skin and the tissue under the skin. The infected area is usually red and tender. This happens most often in the arms and lower legs. HOME CARE   Take your antibiotic medicine as told. Finish the medicine even if you start to feel better.  Keep the infected arm or leg raised (elevated).  Put a warm cloth on the area up to 4 times per day.  Only take medicines as told by your doctor.  Keep all doctor visits as told. GET HELP IF:  You see red streaks on the skin coming from the infected area.  Your red area gets bigger or turns a dark color.  Your bone or joint under the infected area is painful after the skin heals.  Your infection comes back in the same area or different area.  You have a puffy (swollen) bump in the infected area.  You have new symptoms.  You have a fever. GET HELP RIGHT AWAY IF:   You feel very sleepy.  You throw up (vomit) or have watery poop (diarrhea).  You feel sick and have muscle aches and pains.   This information is not intended to replace advice given to you by your health care provider. Make sure you discuss any questions you have with your health care provider.   Document Released: 01/15/2008 Document Revised: 04/19/2015 Document Reviewed: 10/14/2011 Elsevier Interactive Patient Education 2016 Elsevier Inc. Gastroesophageal Reflux Disease, Adult Normally, food travels down the esophagus and stays in the stomach to be digested. If a person has gastroesophageal reflux disease (GERD), food and stomach acid move back up into the esophagus. When this happens, the esophagus becomes sore and swollen (inflamed). Over time, GERD can make small holes (ulcers) in the lining of the esophagus. HOME CARE Diet  Follow a diet as told by your doctor. You may need to avoid foods and drinks such as:  Coffee and tea (with or without caffeine).  Drinks that contain alcohol.  Energy drinks and sports  drinks.  Carbonated drinks or sodas.  Chocolate and cocoa.  Peppermint and mint flavorings.  Garlic and onions.  Horseradish.  Spicy and acidic foods, such as peppers, chili powder, curry powder, vinegar, hot sauces, and BBQ sauce.  Citrus fruit juices and citrus fruits, such as oranges, lemons, and limes.  Tomato-based foods, such as red sauce, chili, salsa, and pizza with red sauce.  Fried and fatty foods, such as donuts, french fries, potato chips, and high-fat dressings.  High-fat meats, such as hot dogs, rib eye steak, sausage, ham, and bacon.  High-fat dairy items, such as whole milk, butter, and cream cheese.  Eat small meals often. Avoid eating large meals.  Avoid drinking large amounts of liquid with your meals.  Avoid eating meals during the 2-3 hours before bedtime.  Avoid lying down right after you eat.  Do not exercise right after you eat. General Instructions  Pay attention to any changes in your symptoms.  Take over-the-counter and prescription medicines only as told by your doctor. Do not take aspirin, ibuprofen, or other NSAIDs unless your doctor says it is okay.  Do not use any tobacco products, including cigarettes, chewing tobacco, and e-cigarettes. If you need help quitting, ask your doctor.  Wear loose clothes. Do not wear anything tight around your waist.  Raise (elevate) the head of your bed about 6 inches (15 cm).  Try to lower your stress. If you need help doing this, ask your doctor.  If  you are overweight, lose an amount of weight that is healthy for you. Ask your doctor about a safe weight loss goal.  Keep all follow-up visits as told by your doctor. This is important. GET HELP IF:  You have new symptoms.  You lose weight and you do not know why it is happening.  You have trouble swallowing, or it hurts to swallow.  You have wheezing or a cough that keeps happening.  Your symptoms do not get better with treatment.  You have  a hoarse voice. GET HELP RIGHT AWAY IF:  You have pain in your arms, neck, jaw, teeth, or back.  You feel sweaty, dizzy, or light-headed.  You have chest pain or shortness of breath.  You throw up (vomit) and your throw up looks like blood or coffee grounds.  You pass out (faint).  Your poop (stool) is bloody or black.  You cannot swallow, drink, or eat.   This information is not intended to replace advice given to you by your health care provider. Make sure you discuss any questions you have with your health care provider.   Document Released: 01/15/2008 Document Revised: 04/19/2015 Document Reviewed: 11/23/2014 Elsevier Interactive Patient Education 2016 [employer]. Food Choices for Gastroesophageal Reflux Disease, Adult When you have gastroesophageal reflux disease (GERD), the foods you eat and your eating habits are very important. Choosing the right foods can help ease your discomfort.  WHAT GUIDELINES DO I NEED TO FOLLOW?   Choose fruits, vegetables, whole grains, and low-fat dairy products.   Choose low-fat meat, fish, and poultry.  Limit fats such as oils, salad dressings, butter, nuts, and avocado.   Keep a food diary. This helps you identify foods that cause symptoms.   Avoid foods that cause symptoms. These may be different for everyone.   Eat small meals often instead of 3 large meals a day.   Eat your meals slowly, in a place where you are relaxed.   Limit fried foods.   Cook foods using methods other than frying.   Avoid drinking alcohol.   Avoid drinking large amounts of liquids with your meals.   Avoid bending over or lying down until 2-3 hours after eating.  WHAT FOODS ARE NOT RECOMMENDED?  These are some foods and drinks that may make your symptoms worse: Vegetables Tomatoes. Tomato juice. Tomato and spaghetti sauce. Chili peppers. Onion and garlic. Horseradish. Fruits Oranges, grapefruit, and lemon (fruit and  juice). Meats High-fat meats, fish, and poultry. This includes hot dogs, ribs, ham, sausage, salami, and bacon. Dairy Whole milk and chocolate milk. Sour cream. Cream. Butter. Ice cream. Cream cheese.  Drinks Coffee and tea. Bubbly (carbonated) drinks or energy drinks. Condiments Hot sauce. Barbecue sauce.  Sweets/Desserts Chocolate and cocoa. Donuts. Peppermint and spearmint. Fats and Oils High-fat foods. This includes [place] fries and potato chips. Other Vinegar. Strong spices. This includes black pepper, white pepper, red pepper, cayenne, curry powder, cloves, ginger, and chili powder. The items listed above may not be a complete list of foods and drinks to avoid. Contact your dietitian for more information.   This information is not intended to replace advice given to you by your health care provider. Make sure you discuss any questions you have with your health care provider.   Document Released: 01/28/2012 Document Revised: 08/19/2014 Document Reviewed: 06/02/2013 Elsevier Interactive Patient Education 2016 Elsevier Inc. Gastritis, Adult Gastritis is soreness and puffiness (inflammation) of the lining of the stomach. If you do not get help, gastritis can  cause bleeding and sores (ulcers) in the stomach. HOME CARE   Only take medicine as told by your doctor.  If you were given antibiotic medicines, take them as told. Finish the medicines even if you start to feel better.  Drink enough fluids to keep your pee (urine) clear or pale yellow.  Avoid foods and drinks that make your problems worse. Foods you may want to avoid include:  Caffeine or alcohol.  Chocolate.  Mint.  Garlic and onions.  Spicy foods.  Citrus fruits, including oranges, lemons, or limes.  Food containing tomatoes, including sauce, chili, salsa, and pizza.  Fried and fatty foods.  Eat small meals throughout the day instead of large meals. GET HELP RIGHT AWAY IF:   You have black or dark red  poop (stools).  You throw up (vomit) blood. It may look like coffee grounds.  You cannot keep fluids down.  Your belly (abdominal) pain gets worse.  You have a fever.  You do not feel better after 1 week.  You have any other questions or concerns. MAKE SURE YOU:   Understand these instructions.  Will watch your condition.  Will get help right away if you are not doing well or get worse.   This information is not intended to replace advice given to you by your health care provider. Make sure you discuss any questions you have with your health care provider.   Document Released: 01/15/2008 Document Revised: 10/21/2011 Document Reviewed: 09/11/2011 Elsevier Interactive Patient Education [employer]

## 2018-08-22 ENCOUNTER — Emergency Department (HOSPITAL_COMMUNITY)
Admission: EM | Admit: 2018-08-22 | Discharge: 2018-08-22 | Disposition: A | Discharge status: Home / Self Care | Payer: Medicaid Other | Attending: Emergency Medicine | Admitting: Emergency Medicine

## 2018-08-22 ENCOUNTER — Encounter (HOSPITAL_COMMUNITY): Payer: Self-pay | Admitting: Emergency Medicine

## 2018-08-22 ENCOUNTER — Emergency Department (HOSPITAL_COMMUNITY): Payer: Medicaid Other

## 2018-08-22 DIAGNOSIS — F17210 Nicotine dependence, cigarettes, uncomplicated: Secondary | ICD-10-CM | POA: Insufficient documentation

## 2018-08-22 DIAGNOSIS — J029 Acute pharyngitis, unspecified: Secondary | ICD-10-CM | POA: Diagnosis not present

## 2018-08-22 DIAGNOSIS — J40 Bronchitis, not specified as acute or chronic: Secondary | ICD-10-CM

## 2018-08-22 DIAGNOSIS — R05 Cough: Secondary | ICD-10-CM | POA: Diagnosis present

## 2018-08-22 MED ORDER — AZITHROMYCIN 250 MG PO TABS: ORAL_TABLET | ORAL | 0 refills | Status: AC

## 2018-08-22 MED ORDER — PREDNISONE 10 MG PO TABS: 40.0000 mg | ORAL_TABLET | Freq: Every day | ORAL | 0 refills | Status: AC

## 2018-08-22 MED ORDER — BENZONATATE 100 MG PO CAPS: 100.0000 mg | ORAL_CAPSULE | Freq: Three times a day (TID) | ORAL | 0 refills | Status: AC

## 2018-08-22 MED ORDER — ALBUTEROL SULFATE HFA 108 (90 BASE) MCG/ACT IN AERS
2.0000 | INHALATION_SPRAY | RESPIRATORY_TRACT | Status: DC | PRN
  Administered 2018-08-22: 2 via RESPIRATORY_TRACT
  Filled 2018-08-22: qty 6.7

## 2018-08-22 NOTE — Discharge Instructions (Addendum)
Follow up with your doctor next week. Return here as needed.

## 2018-08-22 NOTE — ED Provider Notes (Signed)
[facility] Provider Note   CSN: [id number] Arrival date & time: 08/22/18  1713     History   Chief Complaint Chief Complaint  Patient presents with  . Cough  . Sore Throat  . Fever    HPI [name] is a 23 y.o. female who presents to the ED for fever, chills, sore throat, cough and congestion x 4 days. Patient reports her daughter has the same symptoms. Patient reports she does vape and smoke every day.   The history is provided by the patient. No language interpreter was used.  Influenza  Presenting symptoms: cough, fatigue, fever, headache, myalgias, rhinorrhea and sore throat   Presenting symptoms: no nausea and no vomiting   Severity:  Moderate Onset quality:  Gradual Duration:  4 days Progression:  Worsening Chronicity:  New Relieved by:  Nothing Worsened by:  Nothing Ineffective treatments:  OTC medications Associated symptoms: chills, decreased appetite and nasal congestion   Associated symptoms: no ear pain, no neck stiffness and no syncope   Risk factors: sick contacts     Past Medical History:  Diagnosis Date  . Bicornate uterus   . Migraine    Typically takes Ibuprofen;lie down in dark room    Patient Active Problem List   Diagnosis Date Noted  . Status post cesarean delivery 09/16/2014  . Nexplanon insertion 09/16/2014  . Active labor 09/15/2014  . GBS (group B streptococcus) UTI complicating pregnancy 08/17/2014  . Marijuana use 07/26/2014  . [redacted] weeks gestation of pregnancy   . Encounter for routine screening for malformation using ultrasonics   . Tobacco smoking complicating pregnancy in third trimester   . Supervision of normal pregnancy in third trimester 07/24/2014  . Back pain affecting pregnancy 07/11/2014  . No prenatal care in current pregnancy 07/11/2014  . Anemia, iron deficiency 07/26/2013    Past Surgical History:  Procedure Laterality Date  . CESAREAN SECTION N/A 09/16/2014   Procedure:  CESAREAN SECTION;  Surgeon: Levie Heritage, DO;  Location: [facility];  Service: Obstetrics;  Laterality: N/A;  . NO PAST SURGERIES       OB History    Gravida  3   Para  2   Term  2   Preterm      AB  1   Living  2     SAB  1   TAB      Ectopic      Multiple  0   Live Births  2            Home Medications    Prior to Admission medications   Medication Sig Start Date End Date Taking? Authorizing Provider  acetaminophen (TYLENOL) 500 MG tablet Take 1,000 mg by mouth every 6 (six) hours as needed for mild pain.   Yes [provider]  aspirin-sod bicarb-citric acid (ALKA-SELTZER) 325 MG TBEF tablet Take 325 mg by mouth every 6 (six) hours as needed (cold).   Yes [provider]  DM-Doxylamine-Acetaminophen (NYQUIL COLD & FLU PO) Take 30 mLs by mouth as needed (cold).   Yes [provider]  ibuprofen (ADVIL,MOTRIN) 200 MG tablet Take 400-600 mg by mouth every 6 (six) hours as needed for headache or mild pain.   Yes [provider]  azithromycin (ZITHROMAX Z-PAK) 250 MG tablet Take 2 tablets now and then one tablet PO daily for infection 08/22/18   Janne Napoleon, NP  benzonatate (TESSALON) 100 MG capsule Take 1 capsule (100 mg total)  by mouth every 8 (eight) hours. 08/22/18   Janne Napoleon, NP  predniSONE (DELTASONE) 10 MG tablet Take 4 tablets (40 mg total) by mouth daily with breakfast. 08/22/18   Janne Napoleon, NP    Family History Family History  Problem Relation Age of Onset  . Asthma Mother   . Diabetes Mother   . Migraines Mother   . Heart disease Paternal Grandfather   . Brain cancer Paternal Grandfather   . Hypertension Paternal Grandfather   . Stroke Paternal Grandfather   . Kidney disease Cousin        Kidney infections:Maternal  . Migraines Maternal Grandmother     Social History Social History   Tobacco Use  . Smoking status: Current Every Day Smoker    Packs/day: 0.50    Types: Cigarettes    Last attempt to  quit: 07/12/2012    Years since quitting: 6.1  . Smokeless tobacco: Never Used  Substance Use Topics  . Alcohol use: No  . Drug use: No     Allergies   Shrimp [shellfish allergy] and Strawberry extract   Review of Systems Review of Systems  Constitutional: Positive for chills, decreased appetite, fatigue and fever.  HENT: Positive for congestion, rhinorrhea and sore throat. Negative for ear pain.   Eyes: Negative for pain, discharge and itching.  Respiratory: Positive for cough.   Gastrointestinal: Negative for abdominal pain, nausea and vomiting.  Genitourinary: Negative for dysuria and urgency.  Musculoskeletal: Positive for myalgias. Negative for neck stiffness.  Skin: Negative for rash.  Neurological: Positive for headaches. Negative for syncope.  Psychiatric/Behavioral: Negative for confusion.     Physical Exam Updated Vital Signs BP 105/89 (BP Location: Right Arm)   Pulse (!) 110   Temp 98.9 F (37.2 C)   Resp 16   Ht 4\' 11"  (1.499 m)   Wt 59 kg   SpO2 99%   BMI 26.26 kg/m   Physical Exam Vitals signs and nursing note reviewed.  Constitutional:      General: She is not in acute distress.    Appearance: She is well-developed.  HENT:     Head: Normocephalic.     Right Ear: Tympanic membrane normal.     Left Ear: Tympanic membrane normal.     Mouth/Throat:     Mouth: Mucous membranes are moist.     Pharynx: Uvula midline. Posterior oropharyngeal erythema present.  Eyes:     Extraocular Movements:     Right eye: Normal extraocular motion.     Conjunctiva/sclera: Conjunctivae normal.  Neck:     Musculoskeletal: Normal range of motion and neck supple.  Cardiovascular:     Rate and Rhythm: Regular rhythm. Tachycardia present.  Pulmonary:     Effort: Pulmonary effort is normal. No respiratory distress.     Breath sounds: Transmitted upper airway sounds present. Examination of the right-upper field reveals decreased breath sounds. Decreased breath sounds  present. No rales.  Chest:     Chest wall: Tenderness present.  Musculoskeletal: Normal range of motion.  Skin:    General: Skin is warm and dry.  Neurological:     Mental Status: She is alert and oriented to person, place, and time.  Psychiatric:        Mood and Affect: Mood normal.      ED Treatments / Results  Labs (all labs ordered are listed, but only abnormal results are displayed) Labs Reviewed - No data to display  Radiology Dg Chest 2 View  Result  Date: 08/22/2018 CLINICAL DATA:  Cough and fever for 4 days. EXAM: CHEST - 2 VIEW COMPARISON:  None. FINDINGS: The heart size and mediastinal contours are within normal limits. Both lungs are clear. The visualized skeletal structures are unremarkable. IMPRESSION: Negative.  No active cardiopulmonary disease. Electronically Signed   By: Myles RosenthalJohn  Stahl M.D.   On: 08/22/2018 18:34    Procedures Procedures (including critical care time)  Medications Ordered in ED Medications  albuterol (PROVENTIL HFA;VENTOLIN HFA) 108 (90 Base) MCG/ACT inhaler 2 puff (2 puffs Inhalation Given 08/22/18 1905)     Initial Impression / Assessment and Plan / ED Course  I have reviewed the triage vital signs and the nursing notes. 23 y.o. female here with sore throat, cough, fever and body aches stable for d/c without no acute findings on chest x-ray and does not appear toxic. Will treat with Z-pak since patient is an every day smoker and the symptoms have been persistent. Discussed with the patient plan of care and use of cool mist vaporizer. She agrees with plan.   Final Clinical Impressions(s) / ED Diagnoses   Final diagnoses:  Bronchitis  Pharyngitis, unspecified etiology    ED Discharge Orders         Ordered    azithromycin (ZITHROMAX Z-PAK) 250 MG tablet     08/22/18 1847    benzonatate (TESSALON) 100 MG capsule  Every 8 hours     08/22/18 1847    predniSONE (DELTASONE) 10 MG tablet  Daily with breakfast     08/22/18 1847             Kerrie Buffaloeese, [place]ove[place], NP 08/22/18 2201    Doug SouJacubowitz, Sam, MD 08/23/18 0001

## 2018-08-22 NOTE — ED Triage Notes (Signed)
Pt c/o fever at night, chills, sore throat and cough and congestion for 3-4 days. Daughter has same symptoms.

## 2018-08-22 NOTE — ED Notes (Signed)
Denies NV
